# Patient Record
Sex: Male | Born: 1960 | Race: White | Hispanic: No | Marital: Married | Smoking: Never smoker
Health system: Southern US, Community
[De-identification: ages and names within clinical notes are randomized; demographics above are authoritative.]

## PROBLEM LIST (undated history)

## (undated) DIAGNOSIS — R011 Cardiac murmur, unspecified: Secondary | ICD-10-CM

## (undated) DIAGNOSIS — D6851 Activated protein C resistance: Secondary | ICD-10-CM

## (undated) DIAGNOSIS — Z87442 Personal history of urinary calculi: Secondary | ICD-10-CM

## (undated) DIAGNOSIS — J189 Pneumonia, unspecified organism: Secondary | ICD-10-CM

## (undated) DIAGNOSIS — M109 Gout, unspecified: Secondary | ICD-10-CM

## (undated) DIAGNOSIS — I671 Cerebral aneurysm, nonruptured: Secondary | ICD-10-CM

## (undated) DIAGNOSIS — K219 Gastro-esophageal reflux disease without esophagitis: Secondary | ICD-10-CM

## (undated) HISTORY — PX: CEREBRAL ANEURYSM REPAIR: SHX164

## (undated) HISTORY — PX: OTHER SURGICAL HISTORY: SHX169

## (undated) HISTORY — DX: Cerebral aneurysm, nonruptured: I67.1

## (undated) HISTORY — DX: Personal history of urinary calculi: Z87.442

## (undated) HISTORY — DX: Gout, unspecified: M10.9

## (undated) HISTORY — DX: Activated protein C resistance: D68.51

---

## 2018-12-17 ENCOUNTER — Other Ambulatory Visit: Payer: Self-pay | Admitting: Urology

## 2018-12-17 DIAGNOSIS — N281 Cyst of kidney, acquired: Secondary | ICD-10-CM

## 2018-12-18 ENCOUNTER — Ambulatory Visit
Admission: RE | Admit: 2018-12-18 | Discharge: 2018-12-18 | Disposition: A | Discharge status: Home / Self Care | Payer: BLUE CROSS/BLUE SHIELD | Source: Ambulatory Visit | Attending: Urology | Admitting: Urology

## 2018-12-18 DIAGNOSIS — N281 Cyst of kidney, acquired: Secondary | ICD-10-CM

## 2018-12-18 MED ORDER — GADOBENATE DIMEGLUMINE 529 MG/ML IV SOLN
20.0000 mL | Freq: Once | INTRAVENOUS | Status: AC | PRN
  Administered 2018-12-18: 20 mL via INTRAVENOUS

## 2018-12-28 DIAGNOSIS — R310 Gross hematuria: Secondary | ICD-10-CM | POA: Diagnosis not present

## 2018-12-28 DIAGNOSIS — N200 Calculus of kidney: Secondary | ICD-10-CM | POA: Diagnosis not present

## 2018-12-28 DIAGNOSIS — N135 Crossing vessel and stricture of ureter without hydronephrosis: Secondary | ICD-10-CM | POA: Diagnosis not present

## 2019-01-03 DIAGNOSIS — R8271 Bacteriuria: Secondary | ICD-10-CM | POA: Diagnosis not present

## 2019-01-03 DIAGNOSIS — R1084 Generalized abdominal pain: Secondary | ICD-10-CM | POA: Diagnosis not present

## 2019-01-03 DIAGNOSIS — N50811 Right testicular pain: Secondary | ICD-10-CM | POA: Diagnosis not present

## 2019-01-03 DIAGNOSIS — N200 Calculus of kidney: Secondary | ICD-10-CM | POA: Diagnosis not present

## 2019-01-25 DIAGNOSIS — N200 Calculus of kidney: Secondary | ICD-10-CM | POA: Diagnosis not present

## 2019-06-10 DIAGNOSIS — G479 Sleep disorder, unspecified: Secondary | ICD-10-CM | POA: Diagnosis not present

## 2019-08-09 ENCOUNTER — Other Ambulatory Visit: Payer: Self-pay

## 2019-08-09 DIAGNOSIS — Z20822 Contact with and (suspected) exposure to covid-19: Secondary | ICD-10-CM

## 2019-08-10 LAB — NOVEL CORONAVIRUS, NAA: SARS-CoV-2, NAA: NOT DETECTED

## 2020-11-27 DIAGNOSIS — H903 Sensorineural hearing loss, bilateral: Secondary | ICD-10-CM | POA: Diagnosis not present

## 2020-11-27 DIAGNOSIS — H9122 Sudden idiopathic hearing loss, left ear: Secondary | ICD-10-CM | POA: Diagnosis not present

## 2020-12-14 DIAGNOSIS — H9122 Sudden idiopathic hearing loss, left ear: Secondary | ICD-10-CM | POA: Diagnosis not present

## 2020-12-21 DIAGNOSIS — N50811 Right testicular pain: Secondary | ICD-10-CM | POA: Diagnosis not present

## 2020-12-21 DIAGNOSIS — Z87442 Personal history of urinary calculi: Secondary | ICD-10-CM | POA: Diagnosis not present

## 2020-12-21 DIAGNOSIS — Z79899 Other long term (current) drug therapy: Secondary | ICD-10-CM | POA: Diagnosis not present

## 2020-12-21 DIAGNOSIS — N200 Calculus of kidney: Secondary | ICD-10-CM | POA: Diagnosis not present

## 2020-12-21 DIAGNOSIS — R1011 Right upper quadrant pain: Secondary | ICD-10-CM | POA: Diagnosis not present

## 2020-12-21 DIAGNOSIS — R1031 Right lower quadrant pain: Secondary | ICD-10-CM | POA: Diagnosis not present

## 2020-12-21 DIAGNOSIS — R112 Nausea with vomiting, unspecified: Secondary | ICD-10-CM | POA: Diagnosis not present

## 2020-12-21 DIAGNOSIS — N132 Hydronephrosis with renal and ureteral calculous obstruction: Secondary | ICD-10-CM | POA: Diagnosis not present

## 2020-12-21 DIAGNOSIS — Z885 Allergy status to narcotic agent status: Secondary | ICD-10-CM | POA: Diagnosis not present

## 2020-12-22 DIAGNOSIS — N132 Hydronephrosis with renal and ureteral calculous obstruction: Secondary | ICD-10-CM | POA: Diagnosis not present

## 2020-12-24 DIAGNOSIS — N201 Calculus of ureter: Secondary | ICD-10-CM | POA: Diagnosis not present

## 2020-12-24 DIAGNOSIS — N135 Crossing vessel and stricture of ureter without hydronephrosis: Secondary | ICD-10-CM | POA: Diagnosis not present

## 2020-12-24 DIAGNOSIS — R1084 Generalized abdominal pain: Secondary | ICD-10-CM | POA: Diagnosis not present

## 2020-12-28 DIAGNOSIS — N201 Calculus of ureter: Secondary | ICD-10-CM | POA: Diagnosis not present

## 2021-01-05 DIAGNOSIS — N200 Calculus of kidney: Secondary | ICD-10-CM | POA: Diagnosis not present

## 2021-01-08 IMAGING — MR MR ABDOMEN WO/W CM
11 of 17 series · 30 of 48 positions shown · IV contrast (multihance)
Comparison: CT 12/14/2018

CLINICAL DATA: LEFT lower quadrant pain for 2 months. History of
kidney stones. Cystic mass of the LEFT kidney.

EXAM:
MRI ABDOMEN WITHOUT AND WITH CONTRAST
TECHNIQUE: Multiplanar multisequence MR imaging of the abdomen was performed
both before and after the administration of intravenous contrast.
CONTRAST:  20mL MULTIHANCE GADOBENATE DIMEGLUMINE 529 MG/ML IV SOLN

[Series 3: T2 · coronal · 5.0mm · 1.56mm/px · 2 of 20 slices shown (1 of 3)]
[im 1/20]
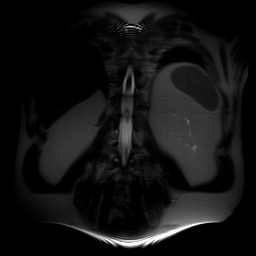
[im 20/20]
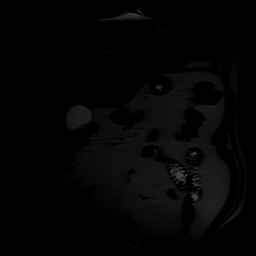

[Series 5: ep2d_diff_b50_500_800_p2 · axial · 6.0mm · 1.98mm/px · z∈[-78,+78]mm · 5 of 63 slices shown]
[im 1/63]
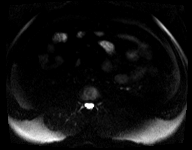
[im 16/63]
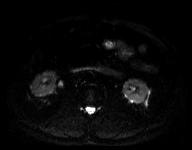
[im 32/63]
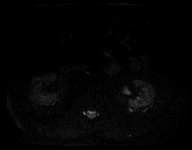
[im 47/63]
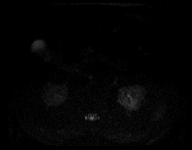
[im 63/63]
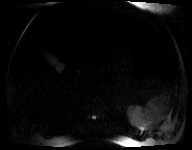

[Series 6: ep2d_diff_b50_500_800_p2_adc · axial · 6.0mm · 1.98mm/px · z∈[-78,+78]mm · 2 of 21 slices shown]
[im 1/21]
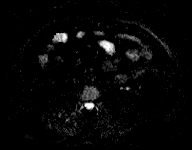
[im 21/21]
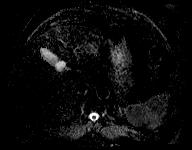

[Series 7: T2 · axial · 6.5mm · 0.74mm/px · z∈[-90,+90]mm · 2 of 24 slices shown (2 of 3)]
[im 1/24]
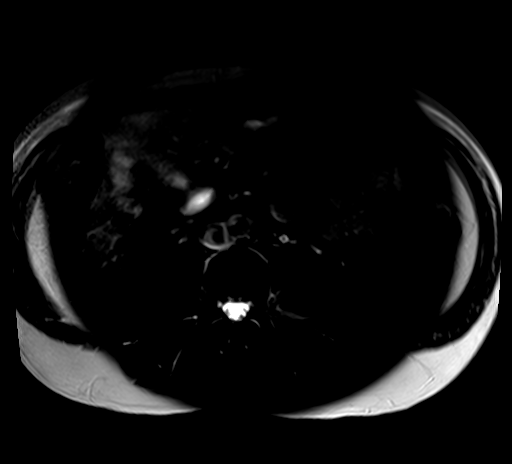
[im 24/24]
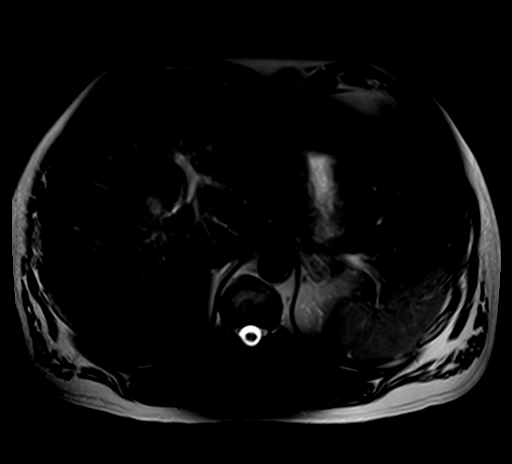

[Series 8: T2 · axial · 5.0mm · 1.48mm/px · z∈[-107,+82]mm · 3 of 30 slices shown (3 of 3)]
[im 1/30]
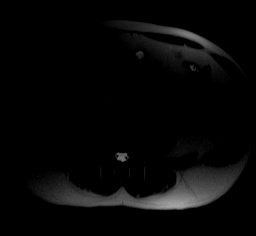
[im 15/30]
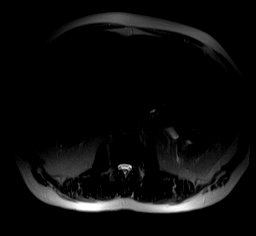
[im 30/30]
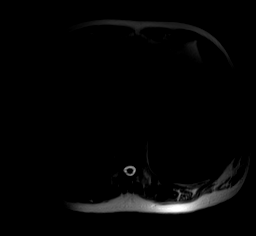

[Series 9: axial tru fisp · axial · 4.5mm · 1.48mm/px · 1 of 26 slices shown]
[im 1/26]
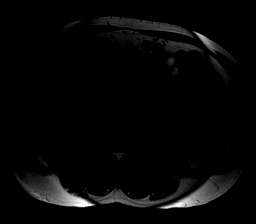

[Series 10: axial in out · axial · 5.5mm · 0.74mm/px · z∈[-98,+73]mm · 3 of 56 slices shown]
[im 1/56]
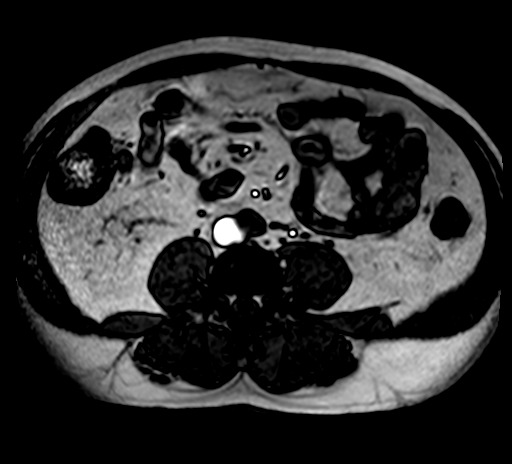
[im 28/56]
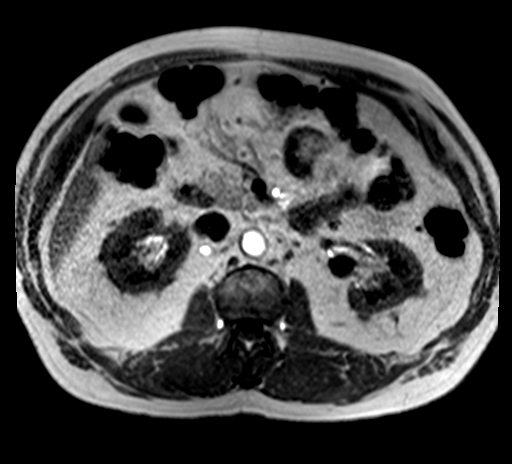
[im 56/56]
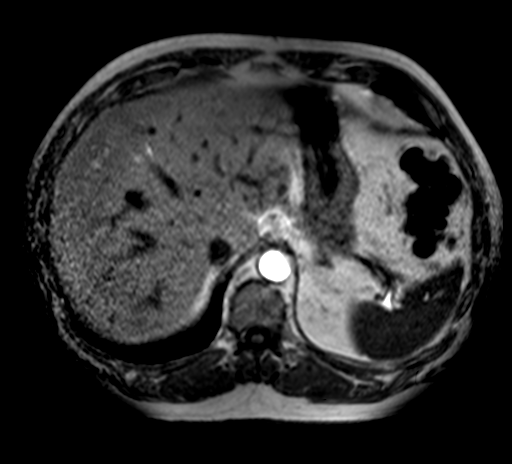

[Series 11: T1 dynamic · axial · non-contrast · 2.5mm · 0.78mm/px · z∈[-86,+61]mm · 3 of 60 slices shown]
[im 1/60]
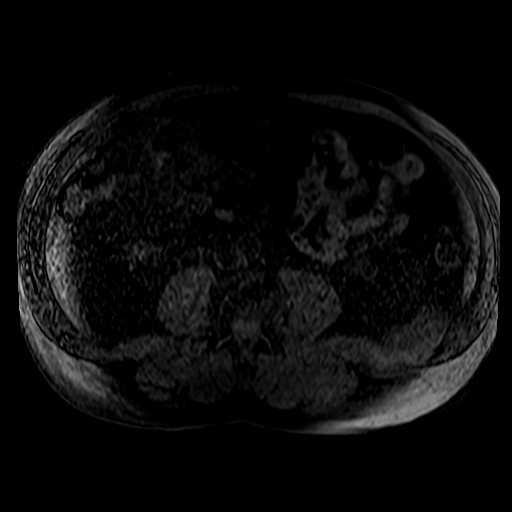
[im 30/60]
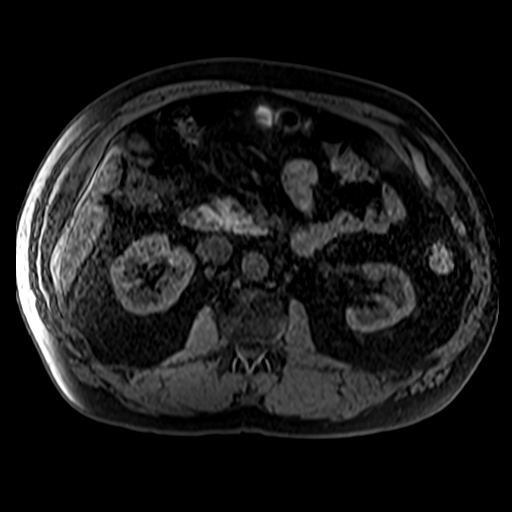
[im 60/60]
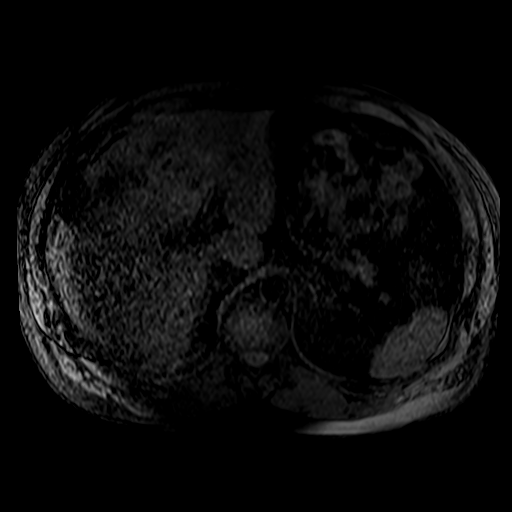

[Series 12: post 25 sec · axial · 2.5mm · 0.78mm/px · z∈[-86,+61]mm · 3 of 60 slices shown]
[im 1/60]
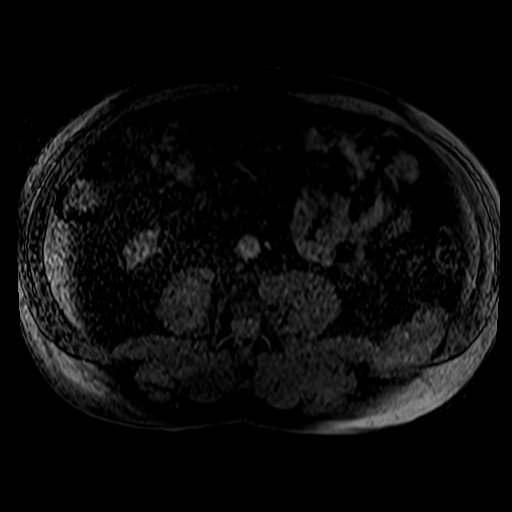
[im 30/60]
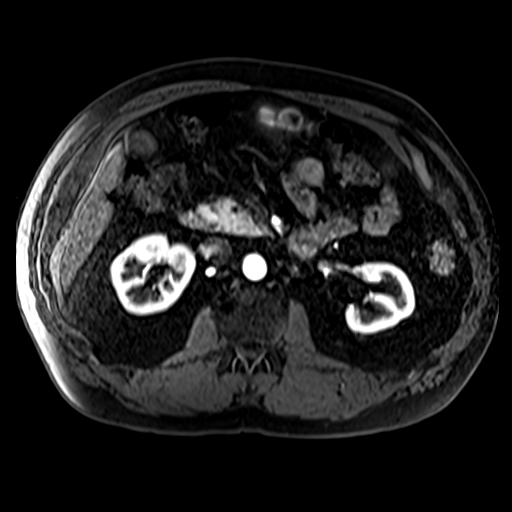
[im 60/60]
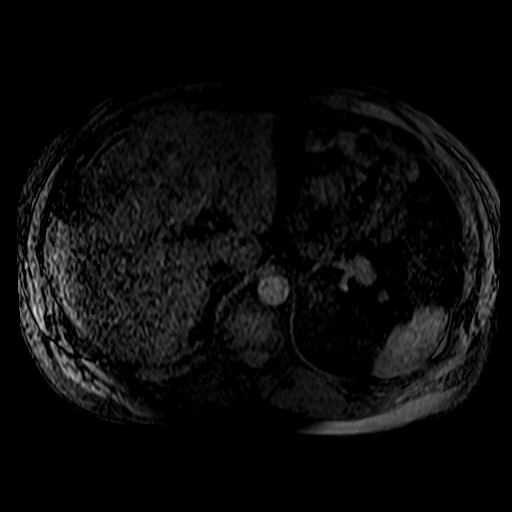

[Series 13: post 25 sec_sub · axial · 2.5mm · 0.78mm/px · z∈[-86,+61]mm · 3 of 60 slices shown]
[im 1/60]
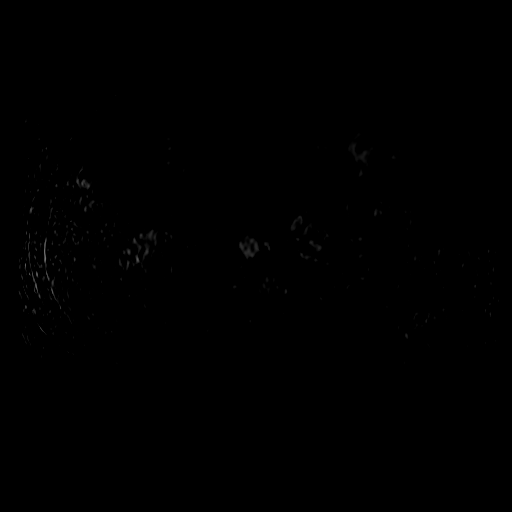
[im 30/60]
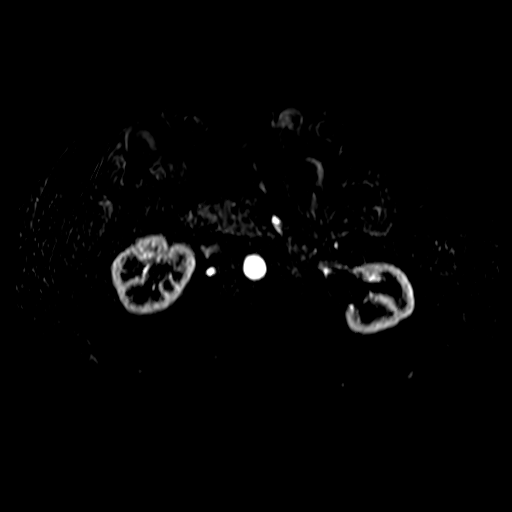
[im 60/60]
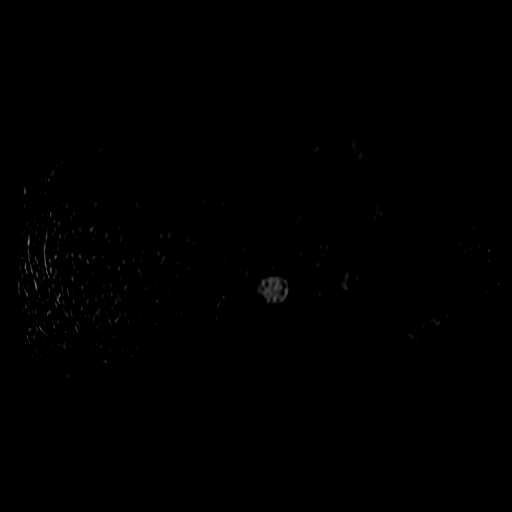

[Series 14: post 45 sec · axial · 2.5mm · 0.78mm/px · z∈[-86,+61]mm · 3 of 60 slices shown]
[im 1/60]
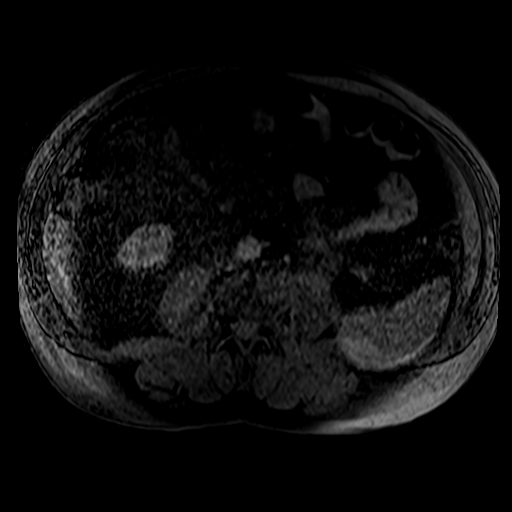
[im 30/60]
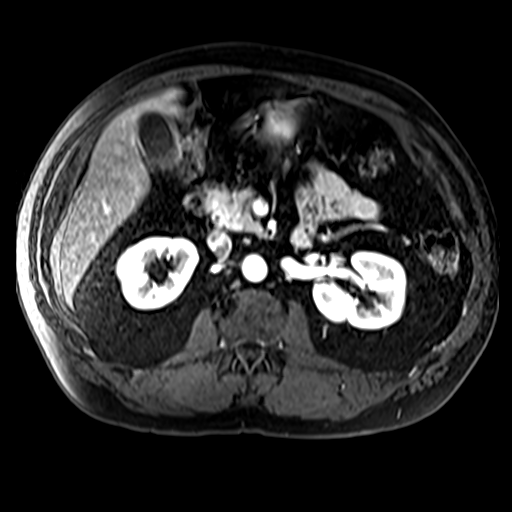
[im 60/60]
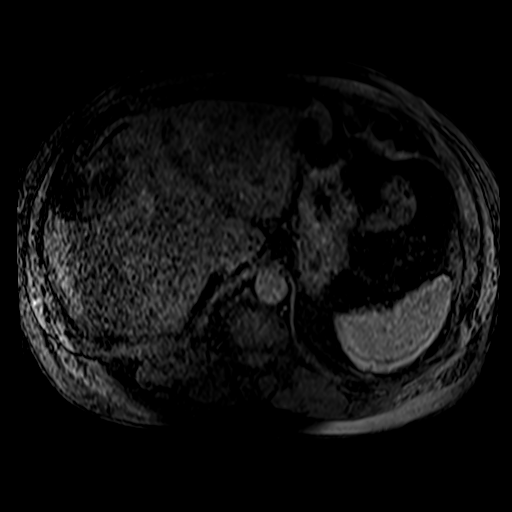

[30 of 48 positions shown; findings below may reference images not displayed]

FINDINGS: Lower chest:  Lung bases are clear.

Hepatobiliary: No focal hepatic lesion. Gallbladder normal. Common
bile duct normal.

Pancreas: Normal pancreatic parenchymal intensity. No ductal
dilatation or inflammation.

Spleen: Normal spleen.

Adrenals/urinary tract: Adrenal glands normal.

The small exophytic lesion extending from the posterior aspect of
the LEFT kidney is not evident on all sequences due to small size.

Lesion noted on the precontrast T1 weighted imaging measuring 9 mm
(image [DATE]) with lesion is isointense to the renal parenchyma..
Postcontrast imaging and subtracted postcontrast imaging
demonstrates no enhancement of this small lesion.

No additional enhancing lesion within LEFT RIGHT kidney.

Stomach/Bowel: Stomach and limited of the small bowel is
unremarkable

Vascular/Lymphatic: Abdominal aortic normal caliber. No
retroperitoneal periportal lymphadenopathy.

Musculoskeletal: No aggressive osseous lesion
IMPRESSION: 1. Nonenhancing lesion of the LEFT kidney is most consistent with
Bosniak 2 hemorrhagic cyst.
2. No acute abdominal findings.

## 2021-06-28 DIAGNOSIS — R509 Fever, unspecified: Secondary | ICD-10-CM | POA: Diagnosis not present

## 2021-06-28 DIAGNOSIS — U071 COVID-19: Secondary | ICD-10-CM | POA: Diagnosis not present

## 2021-07-09 DIAGNOSIS — H9122 Sudden idiopathic hearing loss, left ear: Secondary | ICD-10-CM | POA: Diagnosis not present

## 2021-07-09 DIAGNOSIS — H903 Sensorineural hearing loss, bilateral: Secondary | ICD-10-CM | POA: Diagnosis not present

## 2021-08-23 DIAGNOSIS — M109 Gout, unspecified: Secondary | ICD-10-CM | POA: Diagnosis not present

## 2021-08-23 DIAGNOSIS — R030 Elevated blood-pressure reading, without diagnosis of hypertension: Secondary | ICD-10-CM | POA: Diagnosis not present

## 2021-08-23 DIAGNOSIS — R519 Headache, unspecified: Secondary | ICD-10-CM | POA: Diagnosis not present

## 2021-08-27 ENCOUNTER — Other Ambulatory Visit: Payer: Self-pay | Admitting: Family Medicine

## 2021-08-27 DIAGNOSIS — R519 Headache, unspecified: Secondary | ICD-10-CM

## 2021-08-30 ENCOUNTER — Other Ambulatory Visit: Payer: Self-pay

## 2021-08-30 ENCOUNTER — Ambulatory Visit (INDEPENDENT_AMBULATORY_CARE_PROVIDER_SITE_OTHER): Payer: BC Managed Care – PPO

## 2021-08-30 DIAGNOSIS — H9012 Conductive hearing loss, unilateral, left ear, with unrestricted hearing on the contralateral side: Secondary | ICD-10-CM

## 2021-08-30 DIAGNOSIS — R519 Headache, unspecified: Secondary | ICD-10-CM | POA: Diagnosis not present

## 2021-08-30 DIAGNOSIS — G9389 Other specified disorders of brain: Secondary | ICD-10-CM | POA: Diagnosis not present

## 2021-08-30 MED ORDER — GADOBUTROL 1 MMOL/ML IV SOLN
10.0000 mL | Freq: Once | INTRAVENOUS | Status: AC | PRN
  Administered 2021-08-30: 10 mL via INTRAVENOUS

## 2021-09-01 DIAGNOSIS — E237 Disorder of pituitary gland, unspecified: Secondary | ICD-10-CM | POA: Diagnosis not present

## 2021-09-02 DIAGNOSIS — E237 Disorder of pituitary gland, unspecified: Secondary | ICD-10-CM | POA: Diagnosis not present

## 2021-09-08 DIAGNOSIS — I609 Nontraumatic subarachnoid hemorrhage, unspecified: Secondary | ICD-10-CM | POA: Diagnosis not present

## 2021-09-08 DIAGNOSIS — D497 Neoplasm of unspecified behavior of endocrine glands and other parts of nervous system: Secondary | ICD-10-CM | POA: Diagnosis not present

## 2021-09-08 DIAGNOSIS — R030 Elevated blood-pressure reading, without diagnosis of hypertension: Secondary | ICD-10-CM | POA: Diagnosis not present

## 2021-09-08 DIAGNOSIS — Z6830 Body mass index (BMI) 30.0-30.9, adult: Secondary | ICD-10-CM | POA: Diagnosis not present

## 2021-09-15 DIAGNOSIS — Z13220 Encounter for screening for lipoid disorders: Secondary | ICD-10-CM | POA: Diagnosis not present

## 2021-09-15 DIAGNOSIS — E538 Deficiency of other specified B group vitamins: Secondary | ICD-10-CM | POA: Diagnosis not present

## 2021-09-15 DIAGNOSIS — Z0000 Encounter for general adult medical examination without abnormal findings: Secondary | ICD-10-CM | POA: Diagnosis not present

## 2021-09-22 DIAGNOSIS — I609 Nontraumatic subarachnoid hemorrhage, unspecified: Secondary | ICD-10-CM | POA: Diagnosis not present

## 2021-09-22 DIAGNOSIS — R519 Headache, unspecified: Secondary | ICD-10-CM | POA: Diagnosis not present

## 2021-09-22 DIAGNOSIS — E538 Deficiency of other specified B group vitamins: Secondary | ICD-10-CM | POA: Diagnosis not present

## 2021-09-29 DIAGNOSIS — I609 Nontraumatic subarachnoid hemorrhage, unspecified: Secondary | ICD-10-CM | POA: Diagnosis not present

## 2021-09-29 DIAGNOSIS — D497 Neoplasm of unspecified behavior of endocrine glands and other parts of nervous system: Secondary | ICD-10-CM | POA: Diagnosis not present

## 2021-09-29 DIAGNOSIS — M542 Cervicalgia: Secondary | ICD-10-CM | POA: Diagnosis not present

## 2021-10-01 ENCOUNTER — Encounter (HOSPITAL_COMMUNITY): Payer: Self-pay | Admitting: Radiology

## 2021-10-13 ENCOUNTER — Encounter: Payer: Self-pay | Admitting: Neurology

## 2021-12-01 NOTE — Progress Notes (Signed)
NEUROLOGY CONSULTATION NOTE  Samuel West MRN: [id number] DOB: 1961/07/24  Referring provider: Consuella Lose, MD Primary care provider: Sammuel Hines, PA-C  Reason for consult:  headache  Assessment/Plan:   Probable right-sided cervicogenic headache.  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  Also ondansetron for nausea 3  follow up with PCP regarding blood pressure 4  Obtain MRA report 5  Follow up 4 months.   Subjective:  Samuel West is a 61 year old male who presents for headache.  History supplemented by referring provider's note.  Patient had a SAH secondary to aneurysm rupture in 2006.  He had some headaches afterwards which subsequently resolved.  Otherwise, no history of headaches.    About 4 months ago, he started experiencing new headaches.  He describes severe aching pain from the right occipital/suboccipital region radiating to the right parietal region.  There is associated dizziness, nausea and photophobia.  When severe, he may vomit.  Denies visual disturbance and paresthesias in the back of the head.  Denies radicular symptoms in the right upper extremity. Headaches are daily with variable duration.  Neck movement aggravates.  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  Follow up serum endocrine profile (ACTH, prolactin, IGF-1, etc) was negative.  He followed up with neurosurgery who determined that findings were incidental.  Recommended repeat MRI in a year.  Due to history of aneurysm, he had an MRA of the head that was reportedly unremarkable  (imaging and report not available.  He recommended MRA of head in 5 years.  For headache and neck pain, he was prescribed prednisone, diclofenac and cyclobenzaprine.  Around this same time, he developed hearing loss and tinnitus in his left ear.  He saw ENT and was found to have sensorineural hearing loss in the left ear.  Current NSAIDS/analgesics:  diclofenac 50mg  BID PRN Current muscle relaxants:  cyclobenzaprine 10mg  BID PRN Current Antihypertensive medications:  none Current Antidepressant medications:  none Current Anticonvulsant medications:  none Current anti-CGRP:  none Current Vitamins/Herbal/Supplements:  none Current Antihistamines/Decongestants:  none Other therapy:  none  Past NSAIDS/analgesics:  Excedrin migraine Past steroid:  prednisone  PAST MEDICAL HISTORY: Past Medical History:  Diagnosis Date   Brain aneurysm    Factor 5 Leiden mutation, heterozygous ([facility])    Gout    History of kidney stones      PAST SURGICAL HISTORY: Cerebral aneurysm repair  MEDICATIONS: Current Outpatient Medications on File Prior to Visit  Medication Sig Dispense Refill   allopurinol (ZYLOPRIM) 300 MG tablet Take 300 mg by mouth as needed.     predniSONE (STERAPRED UNI-PAK 21 TAB) 10 MG (21) TBPK tablet See admin instructions.     cyanocobalamin (,VITAMIN B-12,) 1000 MCG/ML injection Inject 1,000 mcg into the muscle every 30 (thirty) days.     cyclobenzaprine (FLEXERIL) 10 MG tablet Take 10 mg by mouth 2 (two) times daily as needed.     diclofenac (CATAFLAM) 50 MG tablet Take 50 mg by mouth 2 (two) times daily as needed.     zolpidem (AMBIEN) 10 MG tablet Take 10 mg by mouth at bedtime.  No current facility-administered medications on file prior to visit.      ALLERGIES: Allergies  Allergen Reactions   Morphine And Related Nausea And Vomiting     FAMILY HISTORY: Family History  Problem Relation Age of Onset   Throat cancer Father     Objective:  Blood pressure (!)  154/105, pulse 94, height 6' (1.829 m), weight 240 lb 6.4 oz (109 kg), SpO2 94 %. General: No acute distress.  Patient appears well-groomed.   Head:  Normocephalic/atraumatic.  Tenderness to palpation of right occipital region. Eyes:  fundi examined but not visualized Neck: supple, no paraspinal tenderness, full range of motion Back: No paraspinal tenderness Heart: regular rate and rhythm Lungs: Clear to auscultation bilaterally. Vascular: No carotid bruits. Neurological Exam: Mental status: alert and oriented to person, place, and time, recent and remote memory intact, fund of knowledge intact, attention and concentration intact, speech fluent and not dysarthric, language intact. Cranial nerves: CN I: not tested CN II: pupils equal, round and reactive to light, visual fields intact CN III, IV, VI:  full range of motion, no nystagmus, no ptosis CN V: facial sensation intact. CN VII: upper and lower face symmetric CN VIII: hearing reduced in left ear CN IX, X: gag intact, uvula midline CN XI: sternocleidomastoid and trapezius muscles intact CN XII: tongue midline Bulk & Tone: normal, no fasciculations. Motor:  muscle strength 5/5 throughout Sensation:  Pinprick, temperature and vibratory sensation intact. Deep Tendon Reflexes:  2+ throughout,  toes downgoing.   Finger to nose testing:  Without dysmetria.   Heel to shin:  Without dysmetria.   Gait:  Normal station and stride.  Romberg negative.    Thank you for allowing me to take part in the care of this patient.  Metta Clines, DO  CC:  Sammuel Hines, PA-C  Consuella Lose, MD

## 2021-12-02 ENCOUNTER — Ambulatory Visit (INDEPENDENT_AMBULATORY_CARE_PROVIDER_SITE_OTHER): Payer: BC Managed Care – PPO | Admitting: Neurology

## 2021-12-02 ENCOUNTER — Encounter: Payer: Self-pay | Admitting: Neurology

## 2021-12-02 ENCOUNTER — Other Ambulatory Visit: Payer: Self-pay

## 2021-12-02 VITALS — BP 154/105 | HR 94 | Ht 72.0 in | Wt 240.4 lb

## 2021-12-02 DIAGNOSIS — H9312 Tinnitus, left ear: Secondary | ICD-10-CM | POA: Diagnosis not present

## 2021-12-02 DIAGNOSIS — H905 Unspecified sensorineural hearing loss: Secondary | ICD-10-CM | POA: Diagnosis not present

## 2021-12-02 DIAGNOSIS — E236 Other disorders of pituitary gland: Secondary | ICD-10-CM

## 2021-12-02 DIAGNOSIS — Z8679 Personal history of other diseases of the circulatory system: Secondary | ICD-10-CM

## 2021-12-02 DIAGNOSIS — G4486 Cervicogenic headache: Secondary | ICD-10-CM

## 2021-12-02 MED ORDER — ONDANSETRON 4 MG PO TBDP: 4.0000 mg | ORAL_TABLET | Freq: Three times a day (TID) | ORAL | 5 refills | Status: DC | PRN

## 2021-12-02 MED ORDER — GABAPENTIN 100 MG PO CAPS: ORAL_CAPSULE | ORAL | 0 refills | Status: DC

## 2021-12-02 NOTE — Patient Instructions (Signed)
Start gabapentin - take 1 pill at bedtime for one week, then increase to 2 pills at bedtime.  When time for refill, contact me with update Continue cyclobenzaprine and diclofenac as needed. Follow up 4 months.

## 2021-12-24 DIAGNOSIS — E538 Deficiency of other specified B group vitamins: Secondary | ICD-10-CM | POA: Diagnosis not present

## 2022-01-11 ENCOUNTER — Ambulatory Visit: Payer: BC Managed Care – PPO | Admitting: Neurology

## 2022-01-17 DIAGNOSIS — U071 COVID-19: Secondary | ICD-10-CM | POA: Diagnosis not present

## 2022-01-17 DIAGNOSIS — I10 Essential (primary) hypertension: Secondary | ICD-10-CM | POA: Diagnosis not present

## 2022-04-01 ENCOUNTER — Ambulatory Visit: Payer: BC Managed Care – PPO | Admitting: Neurology

## 2022-04-11 DIAGNOSIS — M25562 Pain in left knee: Secondary | ICD-10-CM | POA: Diagnosis not present

## 2022-04-11 DIAGNOSIS — M1712 Unilateral primary osteoarthritis, left knee: Secondary | ICD-10-CM | POA: Diagnosis not present

## 2022-04-27 DIAGNOSIS — E538 Deficiency of other specified B group vitamins: Secondary | ICD-10-CM | POA: Diagnosis not present

## 2022-11-09 ENCOUNTER — Other Ambulatory Visit: Payer: Self-pay

## 2022-11-09 ENCOUNTER — Emergency Department (HOSPITAL_BASED_OUTPATIENT_CLINIC_OR_DEPARTMENT_OTHER): Payer: Self-pay

## 2022-11-09 ENCOUNTER — Inpatient Hospital Stay (HOSPITAL_BASED_OUTPATIENT_CLINIC_OR_DEPARTMENT_OTHER)
Admission: EM | Admit: 2022-11-09 | Discharge: 2022-11-12 | DRG: 193 | Disposition: A | Discharge status: Home / Self Care | Payer: Self-pay | Attending: Internal Medicine | Admitting: Internal Medicine

## 2022-11-09 DIAGNOSIS — M109 Gout, unspecified: Secondary | ICD-10-CM | POA: Diagnosis present

## 2022-11-09 DIAGNOSIS — R7989 Other specified abnormal findings of blood chemistry: Secondary | ICD-10-CM | POA: Diagnosis present

## 2022-11-09 DIAGNOSIS — Z808 Family history of malignant neoplasm of other organs or systems: Secondary | ICD-10-CM

## 2022-11-09 DIAGNOSIS — D6851 Activated protein C resistance: Secondary | ICD-10-CM | POA: Diagnosis present

## 2022-11-09 DIAGNOSIS — Z1152 Encounter for screening for COVID-19: Secondary | ICD-10-CM

## 2022-11-09 DIAGNOSIS — J189 Pneumonia, unspecified organism: Principal | ICD-10-CM

## 2022-11-09 DIAGNOSIS — I10 Essential (primary) hypertension: Secondary | ICD-10-CM | POA: Diagnosis present

## 2022-11-09 DIAGNOSIS — Z79899 Other long term (current) drug therapy: Secondary | ICD-10-CM

## 2022-11-09 DIAGNOSIS — K760 Fatty (change of) liver, not elsewhere classified: Secondary | ICD-10-CM | POA: Diagnosis present

## 2022-11-09 DIAGNOSIS — J9601 Acute respiratory failure with hypoxia: Secondary | ICD-10-CM | POA: Diagnosis present

## 2022-11-09 DIAGNOSIS — J45909 Unspecified asthma, uncomplicated: Secondary | ICD-10-CM | POA: Diagnosis present

## 2022-11-09 DIAGNOSIS — Z8673 Personal history of transient ischemic attack (TIA), and cerebral infarction without residual deficits: Secondary | ICD-10-CM

## 2022-11-09 DIAGNOSIS — Z23 Encounter for immunization: Secondary | ICD-10-CM

## 2022-11-09 DIAGNOSIS — R0902 Hypoxemia: Secondary | ICD-10-CM

## 2022-11-09 LAB — CBC WITH DIFFERENTIAL/PLATELET
Abs Immature Granulocytes: 0.55 10*3/uL — ABNORMAL HIGH (ref 0.00–0.07)
Basophils Absolute: 0.2 10*3/uL — ABNORMAL HIGH (ref 0.0–0.1)
Basophils Relative: 1 %
Eosinophils Absolute: 0.1 10*3/uL (ref 0.0–0.5)
Eosinophils Relative: 0 %
HCT: 46.5 % (ref 39.0–52.0)
Hemoglobin: 15.6 g/dL (ref 13.0–17.0)
Immature Granulocytes: 3 %
Lymphocytes Relative: 8 %
Lymphs Abs: 1.5 10*3/uL (ref 0.7–4.0)
MCH: 29.3 pg (ref 26.0–34.0)
MCHC: 33.5 g/dL (ref 30.0–36.0)
MCV: 87.2 fL (ref 80.0–100.0)
Monocytes Absolute: 1.7 10*3/uL — ABNORMAL HIGH (ref 0.1–1.0)
Monocytes Relative: 9 %
Neutro Abs: 15.6 10*3/uL — ABNORMAL HIGH (ref 1.7–7.7)
Neutrophils Relative %: 79 %
Platelets: 416 10*3/uL — ABNORMAL HIGH (ref 150–400)
RBC: 5.33 MIL/uL (ref 4.22–5.81)
RDW: 13.4 % (ref 11.5–15.5)
WBC: 19.5 10*3/uL — ABNORMAL HIGH (ref 4.0–10.5)
nRBC: 0 % (ref 0.0–0.2)

## 2022-11-09 LAB — COMPREHENSIVE METABOLIC PANEL
ALT: 117 U/L — ABNORMAL HIGH (ref 0–44)
AST: 97 U/L — ABNORMAL HIGH (ref 15–41)
Albumin: 3.4 g/dL — ABNORMAL LOW (ref 3.5–5.0)
Alkaline Phosphatase: 81 U/L (ref 38–126)
Anion gap: 11 (ref 5–15)
BUN: 14 mg/dL (ref 8–23)
CO2: 24 mmol/L (ref 22–32)
Calcium: 8.9 mg/dL (ref 8.9–10.3)
Chloride: 98 mmol/L (ref 98–111)
Creatinine, Ser: 1.03 mg/dL (ref 0.61–1.24)
GFR, Estimated: >60 mL/min (ref >60)
Glucose, Bld: 117 mg/dL — ABNORMAL HIGH (ref 70–99)
Potassium: 3.7 mmol/L (ref 3.5–5.1)
Sodium: 133 mmol/L — ABNORMAL LOW (ref 135–145)
Total Bilirubin: 1.1 mg/dL (ref 0.3–1.2)
Total Protein: 8.2 g/dL — ABNORMAL HIGH (ref 6.5–8.1)

## 2022-11-09 LAB — LACTIC ACID, PLASMA: Lactic Acid, Venous: 1.4 mmol/L (ref 0.5–1.9)

## 2022-11-09 MED ORDER — ACETAMINOPHEN 500 MG PO TABS
1000.0000 mg | ORAL_TABLET | Freq: Once | ORAL | Status: AC
  Administered 2022-11-09: 1000 mg via ORAL
  Filled 2022-11-09: qty 2

## 2022-11-09 MED ORDER — ALBUTEROL SULFATE HFA 108 (90 BASE) MCG/ACT IN AERS
2.0000 | INHALATION_SPRAY | RESPIRATORY_TRACT | Status: DC | PRN
  Administered 2022-11-10: 2 via RESPIRATORY_TRACT
  Filled 2022-11-09: qty 6.7

## 2022-11-09 NOTE — ED Provider Notes (Signed)
[facility] Provider Note: Samuel Dell, MD, FACEP  CSN: [id number] MRN: [id number] ARRIVAL: 11/09/22 at 2239 ROOM: [facility]06/[facility]06   CHIEF COMPLAINT  Shortness of Breath   HISTORY OF PRESENT ILLNESS  11/09/22 11:13 PM Samuel West is a 61 y.o. male who developed flulike symptoms in [place] 7 days ago.  Specifically he had fever, body aches, cough, nasal congestion, shortness of breath and general malaise.  He was tested for COVID 5 days ago and this was negative.  He was tested for influenza 3 days ago and this was positive.    His symptoms have subsequently worsened.  He is having difficulty breathing, worse with exertion.  He was noted to be hypoxic in triage with a sat of 91% on room air which decreased to 88% on ambulation.  He was also noted to be febrile and tachycardic and was given acetaminophen.  He has had a loss of appetite and has been more confused than usual.  His cough has been severe at times.  He has not had vomiting or diarrhea.   Past Medical History:  Diagnosis Date   Brain aneurysm    Factor 5 Leiden mutation, heterozygous ([facility])    Gout    History of kidney stones     No past surgical history on file.  Family History  Problem Relation Age of Onset   Throat cancer Father        Prior to Admission medications   Medication Sig Start Date End Date Taking? Authorizing Provider  allopurinol (ZYLOPRIM) 300 MG tablet Take 300 mg by mouth as needed. 07/03/18   [provider]  cyanocobalamin (,VITAMIN B-12,) 1000 MCG/ML injection Inject 1,000 mcg into the muscle every 30 (thirty) days. 09/22/21   [provider]  cyclobenzaprine (FLEXERIL) 10 MG tablet Take 10 mg by mouth 2 (two) times daily as needed. 11/29/21   [provider]  diclofenac (CATAFLAM) 50 MG tablet Take 50 mg by mouth 2 (two) times daily as needed. 11/29/21   [provider]  gabapentin (NEURONTIN) 100 MG capsule Take 1 capsule at bedtime for one week, then  2 capsules at bedtime. 12/02/21   Everlena Cooper, Adam R, DO  ondansetron (ZOFRAN-ODT) 4 MG disintegrating tablet Take 1 tablet (4 mg total) by mouth every 8 (eight) hours as needed for nausea or vomiting. 12/02/21   Everlena Cooper, Adam R, DO  predniSONE (STERAPRED UNI-PAK 21 TAB) 10 MG (21) TBPK tablet See admin instructions. 09/08/21   [provider]  zolpidem (AMBIEN) 10 MG tablet Take 10 mg by mouth at bedtime. 11/09/21   [provider]    Allergies Morphine and related   REVIEW OF SYSTEMS  Negative except as noted here or in the History of Present Illness.   PHYSICAL EXAMINATION  Initial Vital Signs Blood pressure (!) 169/105, pulse (!) 126, temperature (!) 102 F (38.9 C), resp. rate 20, height 6\' 1"  (1.854 m), weight 108.9 kg, SpO2 91 %.  Examination General: Well-developed, well-nourished male in no acute distress; appearance consistent with age of record HENT: normocephalic; atraumatic Eyes: Normal appearance Neck: supple Heart: regular rate and rhythm; tachycardia Lungs: Decreased air movement in bases; shallow breaths Abdomen: soft; nondistended; nontender; bowel sounds present Extremities: No deformity; full range of motion; pulses normal Neurologic: Awake, alert; motor function intact in all extremities and symmetric; no facial droop Skin: Warm and dry Psychiatric: Normal mood and affect   RESULTS  Summary of this visit's results, reviewed and interpreted by myself:  EKG Interpretation  Date/Time:  Wednesday November 09 2022 23:09:09 EST Ventricular Rate:  125 PR Interval:  168 QRS Duration: 94 QT Interval:  292 QTC Calculation: 421 R Axis:   55 Text Interpretation: Sinus tachycardia Anterior infarct , age undetermined Abnormal ECG No previous ECGs available Confirmed by Shanon Rosser [phone]) on 11/09/2022 11:13:21 PM       Laboratory Studies: No results found for this or any previous visit (from the past 24 hour(s)). Imaging Studies: No results  found.  ED COURSE and MDM  Nursing notes, initial and subsequent vitals signs, including pulse oximetry, reviewed and interpreted by myself.  Vitals:   11/09/22 2256 11/09/22 2256  BP:  (!) 169/105  Pulse:  (!) 126  Resp:  20  Temp:  (!) 102 F (38.9 C)  SpO2:  91%  Weight: 108.9 kg   Height: 6\' 1"  (1.854 m)    Medications  albuterol (VENTOLIN HFA) 108 (90 Base) MCG/ACT inhaler 2 puff (has no administration in time range)  acetaminophen (TYLENOL) tablet 1,000 mg (1,000 mg Oral Given 11/09/22 2305)      PROCEDURES  Procedures   ED DIAGNOSES  No diagnosis found.

## 2022-11-09 NOTE — ED Notes (Signed)
Pt was in [place] last week began feeling sick last Wed returned home on Sunday and tested positive for the flu Pt has had increased difficulty breathing x 2 days, today being the worst.  Resp rate 35-40 bpm  RT at bedside

## 2022-11-09 NOTE — ED Triage Notes (Signed)
Pt was diagnosed with flu x 4 days ago. Pt states that his symptoms have worsened. Pt is hypoxic in triage at 91% RA while sitting and sats decrease to 88% when he stands to ambulate.

## 2022-11-10 ENCOUNTER — Encounter (HOSPITAL_BASED_OUTPATIENT_CLINIC_OR_DEPARTMENT_OTHER): Payer: Self-pay

## 2022-11-10 ENCOUNTER — Inpatient Hospital Stay (HOSPITAL_COMMUNITY): Payer: Self-pay

## 2022-11-10 DIAGNOSIS — J9601 Acute respiratory failure with hypoxia: Secondary | ICD-10-CM

## 2022-11-10 DIAGNOSIS — J189 Pneumonia, unspecified organism: Secondary | ICD-10-CM

## 2022-11-10 DIAGNOSIS — I10 Essential (primary) hypertension: Secondary | ICD-10-CM | POA: Diagnosis present

## 2022-11-10 DIAGNOSIS — D6851 Activated protein C resistance: Secondary | ICD-10-CM | POA: Diagnosis present

## 2022-11-10 DIAGNOSIS — J45909 Unspecified asthma, uncomplicated: Secondary | ICD-10-CM | POA: Diagnosis present

## 2022-11-10 DIAGNOSIS — R7989 Other specified abnormal findings of blood chemistry: Secondary | ICD-10-CM | POA: Diagnosis present

## 2022-11-10 HISTORY — DX: Acute respiratory failure with hypoxia: J96.01

## 2022-11-10 LAB — COMPREHENSIVE METABOLIC PANEL
ALT: 95 U/L — ABNORMAL HIGH (ref 0–44)
AST: 65 U/L — ABNORMAL HIGH (ref 15–41)
Albumin: 3.3 g/dL — ABNORMAL LOW (ref 3.5–5.0)
Alkaline Phosphatase: 71 U/L (ref 38–126)
Anion gap: 10 (ref 5–15)
BUN: 14 mg/dL (ref 8–23)
CO2: 22 mmol/L (ref 22–32)
Calcium: 8.5 mg/dL — ABNORMAL LOW (ref 8.9–10.3)
Chloride: 100 mmol/L (ref 98–111)
Creatinine, Ser: 0.74 mg/dL (ref 0.61–1.24)
GFR, Estimated: >60 mL/min (ref >60)
Glucose, Bld: 98 mg/dL (ref 70–99)
Potassium: 3.6 mmol/L (ref 3.5–5.1)
Sodium: 132 mmol/L — ABNORMAL LOW (ref 135–145)
Total Bilirubin: 1 mg/dL (ref 0.3–1.2)
Total Protein: 7.5 g/dL (ref 6.5–8.1)

## 2022-11-10 LAB — CBC WITH DIFFERENTIAL/PLATELET
Abs Immature Granulocytes: 0.58 10*3/uL — ABNORMAL HIGH (ref 0.00–0.07)
Basophils Absolute: 0.1 10*3/uL (ref 0.0–0.1)
Basophils Relative: 1 %
Eosinophils Absolute: 0 10*3/uL (ref 0.0–0.5)
Eosinophils Relative: 0 %
HCT: 42.1 % (ref 39.0–52.0)
Hemoglobin: 14 g/dL (ref 13.0–17.0)
Immature Granulocytes: 3 %
Lymphocytes Relative: 10 %
Lymphs Abs: 2.2 10*3/uL (ref 0.7–4.0)
MCH: 29.7 pg (ref 26.0–34.0)
MCHC: 33.3 g/dL (ref 30.0–36.0)
MCV: 89.4 fL (ref 80.0–100.0)
Monocytes Absolute: 1.7 10*3/uL — ABNORMAL HIGH (ref 0.1–1.0)
Monocytes Relative: 8 %
Neutro Abs: 17.4 10*3/uL — ABNORMAL HIGH (ref 1.7–7.7)
Neutrophils Relative %: 78 %
Platelets: 389 10*3/uL (ref 150–400)
RBC: 4.71 MIL/uL (ref 4.22–5.81)
RDW: 13.6 % (ref 11.5–15.5)
WBC: 22 10*3/uL — ABNORMAL HIGH (ref 4.0–10.5)
nRBC: 0 % (ref 0.0–0.2)

## 2022-11-10 LAB — RESPIRATORY PANEL BY PCR

## 2022-11-10 LAB — BLOOD GAS, ARTERIAL
Acid-Base Excess: 0.1 mmol/L (ref 0.0–2.0)
Bicarbonate: 23.6 mmol/L (ref 20.0–28.0)
O2 Content: 10 L/min
O2 Saturation: 98.7 %
Patient temperature: 37
pCO2 arterial: 34 mmHg (ref 32–48)
pH, Arterial: 7.45 (ref 7.35–7.45)
pO2, Arterial: 93 mmHg (ref 83–108)

## 2022-11-10 LAB — HEPATITIS PANEL, ACUTE
HCV Ab: NONREACTIVE
Hep A IgM: NONREACTIVE
Hep B C IgM: NONREACTIVE
Hepatitis B Surface Ag: NONREACTIVE

## 2022-11-10 LAB — RESP PANEL BY RT-PCR (RSV, FLU A&B, COVID)  RVPGX2
Influenza A by PCR: NEGATIVE
Influenza A by PCR: NEGATIVE
Influenza B by PCR: NEGATIVE
Influenza B by PCR: NEGATIVE
Resp Syncytial Virus by PCR: NEGATIVE
Resp Syncytial Virus by PCR: NEGATIVE
SARS Coronavirus 2 by RT PCR: NEGATIVE
SARS Coronavirus 2 by RT PCR: NEGATIVE

## 2022-11-10 LAB — D-DIMER, QUANTITATIVE: D-Dimer, Quant: 3.61 ug/mL-FEU — ABNORMAL HIGH (ref 0.00–0.50)

## 2022-11-10 LAB — HIV ANTIBODY (ROUTINE TESTING W REFLEX): HIV Screen 4th Generation wRfx: NONREACTIVE

## 2022-11-10 LAB — MRSA NEXT GEN BY PCR, NASAL: MRSA by PCR Next Gen: NOT DETECTED

## 2022-11-10 LAB — STREP PNEUMONIAE URINARY ANTIGEN: Strep Pneumo Urinary Antigen: NEGATIVE

## 2022-11-10 MED ORDER — SODIUM CHLORIDE (PF) 0.9 % IJ SOLN
INTRAMUSCULAR | Status: AC
  Filled 2022-11-10: qty 50

## 2022-11-10 MED ORDER — CYCLOBENZAPRINE HCL 10 MG PO TABS: 10.0000 mg | ORAL_TABLET | Freq: Two times a day (BID) | ORAL | Status: DC | PRN

## 2022-11-10 MED ORDER — ENOXAPARIN SODIUM 40 MG/0.4ML IJ SOSY
40.0000 mg | PREFILLED_SYRINGE | INTRAMUSCULAR | Status: DC
  Administered 2022-11-10 – 2022-11-11 (×2): 40 mg via SUBCUTANEOUS
  Filled 2022-11-10 (×2): qty 0.4

## 2022-11-10 MED ORDER — GUAIFENESIN 100 MG/5ML PO LIQD
15.0000 mL | Freq: Once | ORAL | Status: AC
  Administered 2022-11-10: 15 mL via ORAL
  Filled 2022-11-10: qty 20

## 2022-11-10 MED ORDER — SODIUM CHLORIDE 0.9 % IV SOLN: Freq: Once | INTRAVENOUS | Status: AC

## 2022-11-10 MED ORDER — SODIUM CHLORIDE 0.9 % IV SOLN
1.0000 g | Freq: Once | INTRAVENOUS | Status: AC
  Administered 2022-11-10: 1 g via INTRAVENOUS
  Filled 2022-11-10: qty 10

## 2022-11-10 MED ORDER — IPRATROPIUM-ALBUTEROL 0.5-2.5 (3) MG/3ML IN SOLN
3.0000 mL | Freq: Four times a day (QID) | RESPIRATORY_TRACT | Status: DC | PRN
  Administered 2022-11-11: 3 mL via RESPIRATORY_TRACT
  Filled 2022-11-10: qty 3

## 2022-11-10 MED ORDER — ACETAMINOPHEN 325 MG PO TABS
650.0000 mg | ORAL_TABLET | Freq: Four times a day (QID) | ORAL | Status: DC | PRN
  Administered 2022-11-10 – 2022-11-12 (×7): 650 mg via ORAL
  Filled 2022-11-10 (×7): qty 2

## 2022-11-10 MED ORDER — GABAPENTIN 100 MG PO CAPS
200.0000 mg | ORAL_CAPSULE | Freq: Every day | ORAL | Status: DC
  Administered 2022-11-10 – 2022-11-11 (×2): 200 mg via ORAL
  Filled 2022-11-10 (×2): qty 2

## 2022-11-10 MED ORDER — SODIUM CHLORIDE 0.9 % IV SOLN
500.0000 mg | INTRAVENOUS | Status: DC
  Administered 2022-11-11 – 2022-11-12 (×2): 500 mg via INTRAVENOUS
  Filled 2022-11-10 (×2): qty 5

## 2022-11-10 MED ORDER — SODIUM CHLORIDE 0.9 % IV SOLN
2.0000 g | INTRAVENOUS | Status: DC
  Administered 2022-11-11 – 2022-11-12 (×2): 2 g via INTRAVENOUS
  Filled 2022-11-10 (×3): qty 20

## 2022-11-10 MED ORDER — FLUTICASONE PROPIONATE 50 MCG/ACT NA SUSP
2.0000 | Freq: Every day | NASAL | Status: DC | PRN
  Filled 2022-11-10: qty 16

## 2022-11-10 MED ORDER — ZOLPIDEM TARTRATE 5 MG PO TABS
10.0000 mg | ORAL_TABLET | Freq: Every day | ORAL | Status: DC
  Administered 2022-11-10 – 2022-11-11 (×2): 10 mg via ORAL
  Filled 2022-11-10 (×2): qty 2

## 2022-11-10 MED ORDER — ORAL CARE MOUTH RINSE: 15.0000 mL | OROMUCOSAL | Status: DC | PRN

## 2022-11-10 MED ORDER — MELATONIN 5 MG PO TABS
5.0000 mg | ORAL_TABLET | Freq: Every day | ORAL | Status: DC
  Administered 2022-11-10 – 2022-11-11 (×2): 5 mg via ORAL
  Filled 2022-11-10 (×2): qty 1

## 2022-11-10 MED ORDER — IOHEXOL 350 MG/ML SOLN
75.0000 mL | Freq: Once | INTRAVENOUS | Status: AC | PRN
  Administered 2022-11-10: 75 mL via INTRAVENOUS

## 2022-11-10 MED ORDER — SODIUM CHLORIDE 0.9 % IV SOLN
500.0000 mg | Freq: Once | INTRAVENOUS | Status: AC
  Administered 2022-11-10: 500 mg via INTRAVENOUS
  Filled 2022-11-10: qty 5

## 2022-11-10 MED ORDER — LOSARTAN POTASSIUM 50 MG PO TABS
25.0000 mg | ORAL_TABLET | Freq: Every day | ORAL | Status: DC
  Administered 2022-11-10 – 2022-11-12 (×3): 25 mg via ORAL
  Filled 2022-11-10 (×3): qty 1

## 2022-11-10 MED ORDER — PNEUMOCOCCAL 20-VAL CONJ VACC 0.5 ML IM SUSY
0.5000 mL | PREFILLED_SYRINGE | INTRAMUSCULAR | Status: DC
  Filled 2022-11-10: qty 0.5

## 2022-11-10 MED ORDER — ORAL CARE MOUTH RINSE
15.0000 mL | OROMUCOSAL | Status: DC
  Administered 2022-11-10 – 2022-11-12 (×5): 15 mL via OROMUCOSAL

## 2022-11-10 MED ORDER — HYDRALAZINE HCL 20 MG/ML IJ SOLN
10.0000 mg | Freq: Three times a day (TID) | INTRAMUSCULAR | Status: DC | PRN
  Administered 2022-11-10 – 2022-11-11 (×3): 10 mg via INTRAVENOUS
  Filled 2022-11-10 (×3): qty 1

## 2022-11-10 MED ORDER — ONDANSETRON HCL 4 MG PO TABS: 4.0000 mg | ORAL_TABLET | Freq: Four times a day (QID) | ORAL | Status: DC | PRN

## 2022-11-10 MED ORDER — LORAZEPAM 2 MG/ML IJ SOLN
1.0000 mg | Freq: Once | INTRAMUSCULAR | Status: AC
  Administered 2022-11-10: 1 mg via INTRAVENOUS
  Filled 2022-11-10: qty 1

## 2022-11-10 MED ORDER — METOPROLOL TARTRATE 5 MG/5ML IV SOLN
5.0000 mg | Freq: Four times a day (QID) | INTRAVENOUS | Status: DC | PRN
  Administered 2022-11-10 – 2022-11-12 (×4): 5 mg via INTRAVENOUS
  Filled 2022-11-10 (×4): qty 5

## 2022-11-10 MED ORDER — ACETAMINOPHEN 650 MG RE SUPP: 650.0000 mg | Freq: Four times a day (QID) | RECTAL | Status: DC | PRN

## 2022-11-10 MED ORDER — CHLORHEXIDINE GLUCONATE CLOTH 2 % EX PADS
6.0000 | MEDICATED_PAD | Freq: Every day | CUTANEOUS | Status: DC
  Administered 2022-11-10 – 2022-11-12 (×3): 6 via TOPICAL

## 2022-11-10 MED ORDER — GUAIFENESIN ER 600 MG PO TB12
1200.0000 mg | ORAL_TABLET | Freq: Two times a day (BID) | ORAL | Status: DC
  Administered 2022-11-10 – 2022-11-11 (×3): 1200 mg via ORAL
  Filled 2022-11-10 (×3): qty 2

## 2022-11-10 MED ORDER — INFLUENZA VAC SPLIT QUAD 0.5 ML IM SUSY
0.5000 mL | PREFILLED_SYRINGE | INTRAMUSCULAR | Status: AC
  Administered 2022-11-12: 0.5 mL via INTRAMUSCULAR
  Filled 2022-11-10: qty 0.5

## 2022-11-10 MED ORDER — ONDANSETRON HCL 4 MG/2ML IJ SOLN: 4.0000 mg | Freq: Four times a day (QID) | INTRAMUSCULAR | Status: DC | PRN

## 2022-11-10 NOTE — H&P (Signed)
History and Physical    Patient: Samuel West [id number] DOB: 08/10/1961 DOA: 11/09/2022 DOS: the patient was seen and examined on 11/10/2022 PCP: Roseanna Rainbow, PA-C (Inactive)  Patient coming from: Home  Chief Complaint:  Chief Complaint  Patient presents with   Shortness of Breath   HPI: Samuel West is a 61 y.o. male with medical history significant of asthma, factor V leiden, gout. Presenting with 1 week of shortness of breath. He reports that he had recent travel to [place]. During his stay, he had high fevers and shortness of breath. He had productive cough. He didn't have any sick contacts that was apparent to him. When he came home Monday, he went to urgent care to be assessed. He was notified that he was positive for flu and was given a steroid taper. Despite taking this regimen, he states he has not improved. When his symptoms worsened last night, he decided to come tot he ED for evaluation. He denies any other aggravating or alleviating factors.    Review of Systems: As mentioned in the history of present illness. All other systems reviewed and are negative. Past Medical History:  Diagnosis Date   Brain aneurysm    Factor 5 Leiden mutation, heterozygous (HCC)    Gout    History of kidney stones    History reviewed. No pertinent surgical history. Social History:  has no history on file for tobacco use, alcohol use, and drug use.  Allergies  Allergen Reactions   Morphine And Related Nausea And Vomiting    Family History  Problem Relation Age of Onset   Throat cancer Father     Prior to Admission medications   Medication Sig Start Date End Date Taking? Authorizing Provider  allopurinol (ZYLOPRIM) 300 MG tablet Take 300 mg by mouth as needed. 07/03/18  Yes [provider]  cyanocobalamin (,VITAMIN B-12,) 1000 MCG/ML injection Inject 1,000 mcg into the muscle every 30 (thirty) days. 09/22/21  Yes [provider]  cyclobenzaprine (FLEXERIL) 10  MG tablet Take 10 mg by mouth 2 (two) times daily as needed for muscle spasms. 11/29/21  Yes [provider]  fluticasone (FLONASE) 50 MCG/ACT nasal spray Place 2 sprays into both nostrils daily as needed for allergies. 05/07/18  Yes [provider]  gabapentin (NEURONTIN) 100 MG capsule Take 1 capsule at bedtime for one week, then 2 capsules at bedtime. 12/02/21  Yes Jaffe, Adam R, DO  guaiFENesin-codeine 100-10 MG/5ML syrup Take 5 mLs by mouth every 4 (four) hours as needed for cough. 11/06/22  Yes [provider]  ibuprofen (ADVIL) 200 MG tablet Take 800 mg by mouth every 8 (eight) hours as needed for mild pain. 07/03/17  Yes [provider]  predniSONE (STERAPRED UNI-PAK 21 TAB) 10 MG (21) TBPK tablet See admin instructions. 09/08/21  Yes [provider]  zolpidem (AMBIEN) 10 MG tablet Take 10 mg by mouth at bedtime. 11/09/21  Yes [provider]  albuterol (PROAIR HFA) 108 (90 Base) MCG/ACT inhaler Inhale 2 puffs into the lungs every 4 (four) hours as needed for wheezing or shortness of breath.    [provider]  ondansetron (ZOFRAN-ODT) 4 MG disintegrating tablet Take 1 tablet (4 mg total) by mouth every 8 (eight) hours as needed for nausea or vomiting. 12/02/21   Drema Dallas, DO    Physical Exam: Vitals:   11/10/22 0600 11/10/22 0623 11/10/22 0700 11/10/22 0905  BP: (!) 171/103  (!) 168/111   Pulse: 99 (!) 117 Marland Kitchen)  101   Resp: (!) 28 (!) 32 (!) 30   Temp:  98.5 F (36.9 C)  98.6 F (37 C)  TempSrc:  Oral  Oral  SpO2: 96% 96% 95%   Weight:      Height:       General: 61 y.o. male resting in bed in NAD Eyes: normal sclera ENMT: Nares patent w/o discharge, orophaynx clear, dentition normal, ears w/o discharge/lesions/ulcers Neck: Supple, trachea midline Cardiovascular: RRR, +S1, S2, no m/g/r, equal pulses throughout Respiratory: tachypneic , scattered rhonchi, increased WOB GI: BS+, NDNT, no masses noted, no organomegaly  noted MSK: No e/c/c Neuro: A&O x 3, no focal deficits Psyc: Appropriate interaction and affect, calm/cooperative  Data Reviewed:  Results for orders placed or performed during the hospital encounter of 11/09/22 (from the past 24 hour(s))  Blood culture (routine x 2)     Status: None (Preliminary result)   Collection Time: 11/09/22 11:23 PM   Specimen: Right Antecubital; Blood  Result Value Ref Range   Specimen Description      RIGHT ANTECUBITAL BLOOD Performed at [facility], [address] [place], [place] [address]    Special Requests      BOTTLES DRAWN AEROBIC AND ANAEROBIC Blood Culture adequate volume Performed at [facility], [address] [place], [place] [address]    Culture PENDING    Report Status PENDING   CBC with Differential     Status: Abnormal   Collection Time: 11/09/22 11:27 PM  Result Value Ref Range   WBC 19.5 (H) 4.0 - 10.5 K/uL   RBC 5.33 4.22 - 5.81 MIL/uL   Hemoglobin 15.6 13.0 - 17.0 g/dL   HCT 69.6 78.9 - 38.1 %   MCV 87.2 80.0 - 100.0 fL   MCH 29.3 26.0 - 34.0 pg   MCHC 33.5 30.0 - 36.0 g/dL   RDW 01.7 51.0 - 25.8 %   Platelets 416 (H) 150 - 400 K/uL   nRBC 0.0 0.0 - 0.2 %   Neutrophils Relative % 79 %   Neutro Abs 15.6 (H) 1.7 - 7.7 K/uL   Lymphocytes Relative 8 %   Lymphs Abs 1.5 0.7 - 4.0 K/uL   Monocytes Relative 9 %   Monocytes Absolute 1.7 (H) 0.1 - 1.0 K/uL   Eosinophils Relative 0 %   Eosinophils Absolute 0.1 0.0 - 0.5 K/uL   Basophils Relative 1 %   Basophils Absolute 0.2 (H) 0.0 - 0.1 K/uL   Immature Granulocytes 3 %   Abs Immature Granulocytes 0.55 (H) 0.00 - 0.07 K/uL  Comprehensive metabolic panel     Status: Abnormal   Collection Time: 11/09/22 11:27 PM  Result Value Ref Range   Sodium 133 (L) 135 - 145 mmol/L   Potassium 3.7 3.5 - 5.1 mmol/L   Chloride 98 98 - 111 mmol/L   CO2 24 22 - 32 mmol/L   Glucose, Bld 117 (H) 70 - 99 mg/dL   BUN 14 8 - 23 mg/dL   Creatinine, Ser 5.27 0.61 - 1.24 mg/dL    Calcium 8.9 8.9 - 78.2 mg/dL   Total Protein 8.2 (H) 6.5 - 8.1 g/dL   Albumin 3.4 (L) 3.5 - 5.0 g/dL   AST 97 (H) 15 - 41 U/L   ALT 117 (H) 0 - 44 U/L   Alkaline Phosphatase 81 38 - 126 U/L   Total Bilirubin 1.1 0.3 - 1.2 mg/dL   GFR, Estimated >42 >35 mL/min   Anion gap 11  5 - 15  Lactic acid, plasma     Status: None   Collection Time: 11/09/22 11:28 PM  Result Value Ref Range   Lactic Acid, Venous 1.4 0.5 - 1.9 mmol/L  Resp panel by RT-PCR (RSV, Flu A&B, Covid) Peripheral     Status: None   Collection Time: 11/09/22 11:47 PM   Specimen: Peripheral; Nasal Swab  Result Value Ref Range   SARS Coronavirus 2 by RT PCR NEGATIVE NEGATIVE   Influenza A by PCR NEGATIVE NEGATIVE   Influenza B by PCR NEGATIVE NEGATIVE   Resp Syncytial Virus by PCR NEGATIVE NEGATIVE   CXR: Patchy airspace disease in the left lower lobe concerning for pneumonia.  Assessment and Plan: LLL PNA Acute hypoxic respiratory failure Hx of asthma     - admitted to inpt, SDU     - using HFNC right now; still seems uncomfortable with breathing     - check ABG, d-dimer     - COVID/Flu/RSV negative; strange that he was positive for flu on Monday but negative on our tests last night; repeat flu screen     - continue rocephin (@ 2g q24h given weight) and azithro     - add nebs and guaifenesin  Elevated LFTs     - check hepatitis panel     - check RUQ ab [place]  HTN     - denies history of HTN     - right now will have PRNs available  Hx of Factor V Leiden      - continue outpt follow up  Advance Care Planning:   Code Status: FULL  Consults: None  Family Communication: w/ wife at bedside  Severity of Illness: The appropriate patient status for this patient is INPATIENT. Inpatient status is judged to be reasonable and necessary in order to provide the required intensity of service to ensure the patient's safety. The patient's presenting symptoms, physical exam findings, and initial radiographic and laboratory  data in the context of their chronic comorbidities is felt to place them at high risk for further clinical deterioration. Furthermore, it is not anticipated that the patient will be medically stable for discharge from the hospital within 2 midnights of admission.   * I certify that at the point of admission it is my clinical judgment that the patient will require inpatient hospital care spanning beyond 2 midnights from the point of admission due to high intensity of service, high risk for further deterioration and high frequency of surveillance required.*  Author: Teddy Spike, DO 11/10/2022 9:18 AM  For on call review [email or web address].

## 2022-11-10 NOTE — Progress Notes (Signed)
BP consistently greater than 170 with multiple cuff changes and site changes. Kyle MD notified each time BP remained elevated after medication intervention. RN instructed to monitor and at this time SBP under 180 is okay.

## 2022-11-10 NOTE — Patient Instructions (Signed)
Instructed patient on the proper use of a flutter valve. Patient demonstrated proper use of the device without difficulty and instructed patient to use 10 times Q hr. Patient tolerated well.

## 2022-11-11 LAB — BLOOD CULTURE ID PANEL (REFLEXED) - BCID2

## 2022-11-11 LAB — LEGIONELLA PNEUMOPHILA SEROGP 1 UR AG: L. pneumophila Serogp 1 Ur Ag: NEGATIVE

## 2022-11-11 LAB — COMPREHENSIVE METABOLIC PANEL
ALT: 68 U/L — ABNORMAL HIGH (ref 0–44)
AST: 39 U/L (ref 15–41)
Albumin: 2.4 g/dL — ABNORMAL LOW (ref 3.5–5.0)
Alkaline Phosphatase: 61 U/L (ref 38–126)
Anion gap: 10 (ref 5–15)
BUN: 14 mg/dL (ref 8–23)
CO2: 23 mmol/L (ref 22–32)
Calcium: 8.3 mg/dL — ABNORMAL LOW (ref 8.9–10.3)
Chloride: 101 mmol/L (ref 98–111)
Creatinine, Ser: 0.82 mg/dL (ref 0.61–1.24)
GFR, Estimated: >60 mL/min (ref >60)
Glucose, Bld: 100 mg/dL — ABNORMAL HIGH (ref 70–99)
Potassium: 3.6 mmol/L (ref 3.5–5.1)
Sodium: 134 mmol/L — ABNORMAL LOW (ref 135–145)
Total Bilirubin: 0.8 mg/dL (ref 0.3–1.2)
Total Protein: 6.2 g/dL — ABNORMAL LOW (ref 6.5–8.1)

## 2022-11-11 LAB — CBC
HCT: 40.2 % (ref 39.0–52.0)
Hemoglobin: 13.2 g/dL (ref 13.0–17.0)
MCH: 29.5 pg (ref 26.0–34.0)
MCHC: 32.8 g/dL (ref 30.0–36.0)
MCV: 89.9 fL (ref 80.0–100.0)
Platelets: 394 10*3/uL (ref 150–400)
RBC: 4.47 MIL/uL (ref 4.22–5.81)
RDW: 13.9 % (ref 11.5–15.5)
WBC: 17.4 10*3/uL — ABNORMAL HIGH (ref 4.0–10.5)
nRBC: 0 % (ref 0.0–0.2)

## 2022-11-11 LAB — STREP PNEUMONIAE URINARY ANTIGEN: Strep Pneumo Urinary Antigen: NEGATIVE

## 2022-11-11 MED ORDER — SODIUM CHLORIDE 0.9 % IV SOLN: INTRAVENOUS | Status: DC

## 2022-11-11 MED ORDER — GUAIFENESIN ER 600 MG PO TB12
600.0000 mg | ORAL_TABLET | Freq: Two times a day (BID) | ORAL | Status: DC
  Administered 2022-11-11 – 2022-11-12 (×2): 600 mg via ORAL
  Filled 2022-11-11 (×2): qty 1

## 2022-11-11 MED ORDER — IPRATROPIUM-ALBUTEROL 0.5-2.5 (3) MG/3ML IN SOLN
3.0000 mL | Freq: Four times a day (QID) | RESPIRATORY_TRACT | Status: DC
  Administered 2022-11-11 (×3): 3 mL via RESPIRATORY_TRACT
  Filled 2022-11-11 (×3): qty 3

## 2022-11-11 MED ORDER — DEXTROMETHORPHAN POLISTIREX ER 30 MG/5ML PO SUER
30.0000 mg | Freq: Two times a day (BID) | ORAL | Status: DC
  Administered 2022-11-11 (×2): 30 mg via ORAL
  Filled 2022-11-11 (×3): qty 5

## 2022-11-11 NOTE — Progress Notes (Signed)
PHARMACY - PHYSICIAN COMMUNICATION CRITICAL VALUE ALERT - BLOOD CULTURE IDENTIFICATION (BCID)  Samuel West is an 61 y.o. male who presented to [facility] on 11/09/2022 with pneumonia.  Assessment:  BCID + for Staph epidermidis (no methicillin resistance detected) in 1 out of 4 bottles; likely a contaminant.    Name of physician (or Provider) Contacted: Anthoney Harada, FNP  Current antibiotics: Ceftriaxone/Azithromycin for CAP  Changes to prescribed antibiotics recommended:  Patient is on recommended antibiotics - No changes needed  Results for orders placed or performed during the hospital encounter of 11/09/22  Blood Culture ID Panel (Reflexed) (Collected: 11/09/2022 11:23 PM)  Result Value Ref Range   Enterococcus faecalis NOT DETECTED NOT DETECTED   Enterococcus Faecium NOT DETECTED NOT DETECTED   Listeria monocytogenes NOT DETECTED NOT DETECTED   Staphylococcus species DETECTED (A) NOT DETECTED   Staphylococcus aureus (BCID) NOT DETECTED NOT DETECTED   Staphylococcus epidermidis DETECTED (A) NOT DETECTED   Staphylococcus lugdunensis NOT DETECTED NOT DETECTED   Streptococcus species NOT DETECTED NOT DETECTED   Streptococcus agalactiae NOT DETECTED NOT DETECTED   Streptococcus pneumoniae NOT DETECTED NOT DETECTED   Streptococcus pyogenes NOT DETECTED NOT DETECTED   A.calcoaceticus-baumannii NOT DETECTED NOT DETECTED   Bacteroides fragilis NOT DETECTED NOT DETECTED   Enterobacterales NOT DETECTED NOT DETECTED   Enterobacter cloacae complex NOT DETECTED NOT DETECTED   Escherichia coli NOT DETECTED NOT DETECTED   Klebsiella aerogenes NOT DETECTED NOT DETECTED   Klebsiella oxytoca NOT DETECTED NOT DETECTED   Klebsiella pneumoniae NOT DETECTED NOT DETECTED   Proteus species NOT DETECTED NOT DETECTED   Salmonella species NOT DETECTED NOT DETECTED   Serratia marcescens NOT DETECTED NOT DETECTED   Haemophilus influenzae NOT DETECTED NOT DETECTED   Neisseria meningitidis NOT  DETECTED NOT DETECTED   Pseudomonas aeruginosa NOT DETECTED NOT DETECTED   Stenotrophomonas maltophilia NOT DETECTED NOT DETECTED   Candida albicans NOT DETECTED NOT DETECTED   Candida auris NOT DETECTED NOT DETECTED   Candida glabrata NOT DETECTED NOT DETECTED   Candida krusei NOT DETECTED NOT DETECTED   Candida parapsilosis NOT DETECTED NOT DETECTED   Candida tropicalis NOT DETECTED NOT DETECTED   Cryptococcus neoformans/gattii NOT DETECTED NOT DETECTED   Methicillin resistance mecA/C NOT DETECTED NOT DETECTED    Maryellen Pile, PharmD 11/11/2022  9:55 PM

## 2022-11-11 NOTE — TOC Initial Note (Signed)
Transition of Care [facility]) - Initial/Assessment Note    Patient Details  Name: Samuel West MRN: [id number] Date of Birth: February 13, 1961  [facility] CM/SW Contact:    Lavenia Atlas, RN Phone Number: 11/11/2022, 5:18 PM  Clinical Narrative:  Patient from home with wife [name], who reports prior to admission patient does not have any DME or HH in place. No current TOC needs.   TOC will continue to follow for discharge needs.                    Expected Discharge Plan: Home/Self Care Barriers to Discharge: Continued Medical Work up   Patient Goals and CMS Choice Patient states their goals for this hospitalization and ongoing recovery are:: return home   Choice offered to / list presented to : NA      Expected Discharge Plan and Services In-house Referral: NA Discharge Planning Services: CM Consult   Living arrangements for the past 2 months: Single Family Home                 DME Arranged: N/A DME Agency: NA       HH Arranged: NA HH Agency: NA        Prior Living Arrangements/Services Living arrangements for the past 2 months: Single Family Home Lives with:: Spouse Patient language and need for interpreter reviewed:: Yes Do you feel safe going back to the place where you live?: Yes      Need for Family Participation in Patient Care: No (Comment) Care giver support system in place?: Yes (comment)   Criminal Activity/Legal Involvement Pertinent to Current Situation/Hospitalization: No - Comment as needed  Activities of Daily Living Home Assistive Devices/Equipment: Eyeglasses ADL Screening (condition at time of admission) Patient's cognitive ability adequate to safely complete daily activities?: Yes Is the patient deaf or have difficulty hearing?: Yes (R ear) Does the patient have difficulty seeing, even when wearing glasses/contacts?: No Does the patient have difficulty concentrating, remembering, or making decisions?: No Patient able to express need  for assistance with ADLs?: Yes Does the patient have difficulty dressing or bathing?: No Independently performs ADLs?: Yes (appropriate for developmental age) Does the patient have difficulty walking or climbing stairs?: No Weakness of Legs: None Weakness of Arms/Hands: None  Permission Sought/Granted Permission sought to share information with : Case Manager Permission granted to share information with : Yes, Verbal Permission Granted  Share Information with NAME: Case Manager           Emotional Assessment Appearance:: Appears stated age Attitude/Demeanor/Rapport: Gracious Affect (typically observed): Accepting Orientation: : Oriented to Self, Oriented to Place, Oriented to  Time, Oriented to Situation Alcohol / Substance Use: Other (comment) Psych Involvement: No (comment)  Admission diagnosis:  Hypoxia [R09.02] CAP (community acquired pneumonia) [J18.9] Community acquired pneumonia of left lower lobe of lung [J18.9] Patient Active Problem List   Diagnosis Date Noted   CAP (community acquired pneumonia) 11/10/2022   HTN (hypertension) 11/10/2022   Elevated LFTs 11/10/2022   Asthma, chronic 11/10/2022   Factor V Leiden (HCC) 11/10/2022   Acute respiratory failure with hypoxia (HCC) 11/10/2022   PCP:  Shirlyn Goltz (Inactive) Pharmacy:   [facility] [place], [place] - [address] [place] [address] Phone: [phone] Fax: [phone]     Social Determinants of Health (SDOH) Social History: SDOH Screenings   Food Insecurity: No Food Insecurity (11/10/2022)  Housing: Low Risk  (11/10/2022)  Transportation Needs: No Transportation Needs (11/10/2022)  Utilities: Not At Risk (11/10/2022)  Tobacco Use: Low Risk  (11/10/2022)   SDOH Interventions:     Readmission Risk Interventions     No data to display

## 2022-11-11 NOTE — Progress Notes (Addendum)
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, blood pressure, patient diagnosed with factor V Leiden after stroke diagnosed with atrial influenza ~12/15 Did not receive any treatment for this-had fever body aches chills etc.--2/2 worsening of symptoms with difficulty breathing hypoxia found to also be febrile tachycardic  Evaluation on admission revealed white count 22, BUN/creatinine 14/0.7 sodium 132 Hepatitis panel nonreactive respiratory viral panel negative CT angiogram chest = patchy peripheral infiltrates both lungs left upper and right apical lobe, fatty infiltration of the liver US ABD = layering hyperechoic discharge for possible 1.9 metaglip stone in gallbladder no evidence of cholecystitis CBD not visualized  Patient admitted with working diagnosis of multifocal PNA with respiratory failure-blood culture pending  Hospital-Problem based course  Multifocal pneumonia with acute hypoxic respiratory failure, RVP negative -Continue azithromycin and ceftriaxone and can narrow probably in 48 hours to oral meds - For completion sake get strep pneumo Legionella + follow blood culture -Add Delsym to meds to suppress cough-would cut back dose of Mucinex a little bit add flutter valve -Does not have appetite although drinking I would place on fluids for 24 hours and reassess - Try to de-escalate oxygen from high flow and keep on stepdown unit today given new oxygen requirement with severe respiratory distress on admission  Recent flu infection -After first 48 hours usually Tamiflu not indicated-hold, hold steroids - Would keep on flu precautions and droplet precautions for now and discontinue  in 48 hours  SAH status post rupture  status postrepair 2006 with cervicogenic headaches O-utpatient follow-up -At this time continue gabapentin 200 at bedtime, Flexeril 10 twice daily as needed spasm which her home  Benign pituitary mass diagnosed 08/2021 as above - Outpatient follow-up again with Dr. Everlena Cooper of neurology  Coincidental diagnosis possible fatty liver - Outpatient follow-up and dedicated scan, will need counseling about diet and weight loss at that time if needed  DVT prophylaxis: Lovenox Code Status: Full Family Communication: Wife at bedside Nevin Grizzle phone [phone] Disposition:  Status is: Inpatient Remains inpatient appropriate because: Because still has pneumonia and is sick     Consultants:  None  Procedures: No  Antimicrobials: Azithromycin and ceftriaxone   Subjective: Awake alert coughing quite a bit not hungry but is managing some fluids no chest pain no fever Still feels weak overall and debilitated No chest pain  Objective: Vitals:   11/11/22 0200 11/11/22 0300 11/11/22 0400 11/11/22 0500  BP: (!) 182/90 (!) 172/118 (!) 175/99 (!) 142/89  Pulse: 69 71 74 86  Resp: (!) 30 (!) 32 (!) 31 (!) 29  Temp:   98.6 F (37 C)   TempSrc:   Axillary   SpO2: 93% 96% (!) 87% 91%  Weight:      Height:        Intake/Output Summary (Last 24 hours) at 11/11/2022 7322 Last data filed at 11/11/2022 0400 Gross per 24 hour  Intake 350 ml  Output 2200 ml  Net -1850 ml   Filed Weights   11/09/22 2256  Weight: 108.9 kg    Examination:  EOMI NCAT no focal deficit no icterus no pallor  Does have some rales and rhonchi Abdomen is obese nontender no rebound No lower extremity edema ROM is intact without any focal deficit  Data Reviewed: personally reviewed  CBC    Component Value Date/Time   WBC 17.4 (H) 11/11/2022 0306   RBC 4.47 11/11/2022 0306   HGB 13.2 11/11/2022 0306   HCT 40.2 11/11/2022 0306   PLT 394 11/11/2022 0306   MCV 89.9 11/11/2022 0306   MCH 29.5 11/11/2022 0306    MCHC 32.8 11/11/2022 0306   RDW 13.9 11/11/2022 0306   LYMPHSABS 2.2 11/10/2022 1015   MONOABS 1.7 (H) 11/10/2022 1015   EOSABS 0.0 11/10/2022 1015   BASOSABS 0.1 11/10/2022 1015      Latest Ref Rng & Units 11/11/2022    3:06 AM 11/10/2022   10:15 AM 11/09/2022   11:27 PM  CMP  Glucose 70 - 99 mg/dL 782  98  956   BUN 8 - 23 mg/dL 14  14  14    Creatinine 0.61 - 1.24 mg/dL  2.13  0.86   Sodium 135 - 145 mmol/L 134  132  133   Potassium 3.5 - 5.1 mmol/L 3.6  3.6  3.7   Chloride 98 - 111 mmol/L 101  100  98   CO2 22 - 32 mmol/L 23  22  24    Calcium 8.9 - 10.3 mg/dL 8.3  8.5  8.9   Total Protein 6.5 - 8.1 g/dL 6.2  7.5  8.2   Total Bilirubin 0.3 - 1.2 mg/dL 0.8  1.0  1.1   Alkaline Phos 38 - 126 U/L 61  71  81   AST 15 - 41 U/L 39  65  97   ALT 0 - 44 U/L 68  95  117      Radiology Studies: 5.78 Abdomen Limited RUQ (LIVER/GB)  Result Date: 11/10/2022 CLINICAL DATA:  Elevated LFTs EXAM: ULTRASOUND ABDOMEN LIMITED RIGHT UPPER QUADRANT COMPARISON:  None Available. FINDINGS: Gallbladder: Nondilated gallbladder. There is a layering hyperechoic sludge or 1.9 cm ovoid stone in the gallbladder. No wall thickening or pericholecystic fluid. Negative sonographic Murphy sign. Common bile duct: Not visualized. Liver: Diffusely increased liver echogenicity. No focal lesion identified. Portal vein is patent on color Doppler imaging with normal direction of blood flow towards the liver. Other: Limited exam due to patient condition. IMPRESSION: Limited exam due to patient condition. Hepatic steatosis. Layering hyperechoic sludge or 1.9 cm ovoid stone in the gallbladder. No evidence of cholecystitis.  Common bile duct is not visualized. Electronically Signed   By: [place] M.D.   On: 11/10/2022 13:24   CT Angio Chest Pulmonary Embolism (PE) W or WO Contrast  Result Date: 11/10/2022 CLINICAL DATA:  Shortness of breath, fever, high clinical suspicion of pulmonary embolism, history of factor V  Leiden deficiency EXAM: CT ANGIOGRAPHY CHEST WITH CONTRAST TECHNIQUE: Multidetector CT imaging of the chest was performed using the standard protocol during bolus administration of intravenous contrast. Multiplanar CT image reconstructions and MIPs were obtained to evaluate the vascular anatomy. RADIATION DOSE REDUCTION: This exam was performed according to the departmental dose-optimization program which includes automated exposure control, adjustment of the mA and/or kV according to patient size and/or use of iterative reconstruction technique. CONTRAST:  31mL OMNIPAQUE IOHEXOL 350 MG/ML SOLN IV COMPARISON:  None Available. FINDINGS: Cardiovascular: Aorta normal caliber without aneurysm or dissection. Heart size normal. Mild coronary arterial calcifications noted. No pericardial effusion. Assessment of pulmonary arteries is significantly limited by respiratory motion. No definite large central pulmonary emboli are seen. Unable to exclude smaller and peripheral emboli by this exam. Mediastinum/Nodes: Esophagus unremarkable. Base of cervical region normal appearance. No adenopathy. Lungs/Pleura: Patchy peripheral  infiltrates in both lungs greater in LEFT upper lobe and RIGHT apex. Cannot exclude COVID-19 with this appearance. No pleural effusion or pneumothorax. Upper Abdomen: Fatty infiltration of liver. Nonobstructing upper pole LEFT renal calculi. Musculoskeletal: Chronic appearing height loss of T12 vertebral body. No acute osseous findings. Review of the MIP images confirms the above findings. IMPRESSION: Assessment of pulmonary arteries is significantly limited by respiratory motion. No definite large central pulmonary emboli are seen. Unable to exclude smaller and peripheral emboli by this exam. Patchy peripheral infiltrates in both lungs greater in LEFT upper lobe and RIGHT apex. Cannot exclude COVID-19 with this appearance. Fatty infiltration of liver. Nonobstructing upper pole LEFT renal calculi.  Electronically Signed   By: Ulyses Southward M.D.   On: 11/10/2022 12:39   DG Chest 2 View  Result Date: 11/10/2022 CLINICAL DATA:  Shortness of breath EXAM: CHEST - 2 VIEW COMPARISON:  Chest x-ray 05/21/2018 FINDINGS: There is patchy airspace disease in the left lower lobe. There is no pleural effusion or pneumothorax. The cardiomediastinal silhouette is within normal limits. No acute fractures are seen. IMPRESSION: Patchy airspace disease in the left lower lobe concerning for pneumonia. Electronically Signed   By: Darliss Cheney M.D.   On: 11/10/2022 00:00     Scheduled Meds:  Chlorhexidine Gluconate Cloth  6 each Topical Daily   enoxaparin (LOVENOX) injection  40 mg Subcutaneous Q24H   gabapentin  200 mg Oral QHS   guaiFENesin  1,200 mg Oral BID   influenza vac split quadrivalent PF  0.5 mL Intramuscular Tomorrow-1000   ipratropium-albuterol  3 mL Nebulization Q6H   losartan  25 mg Oral Daily   melatonin  5 mg Oral QHS   mouth rinse  15 mL Mouth Rinse 4 times per day   pneumococcal 20-valent conjugate vaccine  0.5 mL Intramuscular Tomorrow-1000   zolpidem  10 mg Oral QHS   Continuous Infusions:  azithromycin Stopped (11/11/22 0326)   cefTRIAXone (ROCEPHIN)  IV Stopped (11/11/22 0146)     LOS: 1 day   Time spent: 27  Rhetta Mura, MD [facility] To contact the attending provider between 7A-7P or the covering provider during after hours 7P-7A, please log into the web site [email or web address] and access using universal [place] password for that web site. If you do not have the password, please call the hospital operator.  11/11/2022, 7:21 AM

## 2022-11-12 ENCOUNTER — Encounter (HOSPITAL_COMMUNITY): Payer: Self-pay | Admitting: Internal Medicine

## 2022-11-12 DIAGNOSIS — I10 Essential (primary) hypertension: Secondary | ICD-10-CM

## 2022-11-12 DIAGNOSIS — J9601 Acute respiratory failure with hypoxia: Secondary | ICD-10-CM

## 2022-11-12 MED ORDER — IPRATROPIUM-ALBUTEROL 0.5-2.5 (3) MG/3ML IN SOLN
3.0000 mL | Freq: Three times a day (TID) | RESPIRATORY_TRACT | Status: DC
  Administered 2022-11-12: 3 mL via RESPIRATORY_TRACT

## 2022-11-12 MED ORDER — HYDRALAZINE HCL 20 MG/ML IJ SOLN
10.0000 mg | Freq: Four times a day (QID) | INTRAMUSCULAR | Status: DC | PRN
  Administered 2022-11-12: 10 mg via INTRAVENOUS
  Filled 2022-11-12: qty 1

## 2022-11-12 MED ORDER — CEFDINIR 300 MG PO CAPS: 300.0000 mg | ORAL_CAPSULE | Freq: Two times a day (BID) | ORAL | 0 refills | Status: AC

## 2022-11-12 MED ORDER — DEXTROMETHORPHAN POLISTIREX ER 30 MG/5ML PO SUER: 30.0000 mg | Freq: Two times a day (BID) | ORAL | 0 refills | Status: AC

## 2022-11-12 MED ORDER — GUAIFENESIN ER 600 MG PO TB12: 600.0000 mg | ORAL_TABLET | Freq: Two times a day (BID) | ORAL | 0 refills | Status: AC

## 2022-11-12 MED ORDER — HYDROCOD POLI-CHLORPHE POLI ER 10-8 MG/5ML PO SUER: 5.0000 mL | Freq: Two times a day (BID) | ORAL | 0 refills | Status: DC | PRN

## 2022-11-12 MED ORDER — ACETAMINOPHEN 325 MG PO TABS: 650.0000 mg | ORAL_TABLET | Freq: Four times a day (QID) | ORAL | Status: DC | PRN

## 2022-11-12 MED ORDER — AZITHROMYCIN 250 MG PO TABS: 500.0000 mg | ORAL_TABLET | Freq: Every day | ORAL | Status: DC

## 2022-11-12 MED ORDER — LOSARTAN POTASSIUM 25 MG PO TABS: 25.0000 mg | ORAL_TABLET | Freq: Every day | ORAL | 2 refills | Status: AC

## 2022-11-12 MED ORDER — AZITHROMYCIN 500 MG PO TABS: 500.0000 mg | ORAL_TABLET | Freq: Every day | ORAL | 0 refills | Status: AC

## 2022-11-12 MED ORDER — HYDROCOD POLI-CHLORPHE POLI ER 10-8 MG/5ML PO SUER: 5.0000 mL | Freq: Two times a day (BID) | ORAL | Status: DC | PRN

## 2022-11-12 NOTE — Progress Notes (Addendum)
PHARMACIST - PHYSICIAN COMMUNICATION DR:   Denton Lank CONCERNING: Antibiotic IV to Oral Route Change Policy  RECOMMENDATION: This patient is receiving azithromycin by the intravenous route.  Based on criteria approved by the Pharmacy and Therapeutics Committee, the antibiotic(s) is/are being converted to the equivalent oral dose form(s).   DESCRIPTION: These criteria include: Patient being treated for a respiratory tract infection, urinary tract infection, cellulitis or clostridium difficile associated diarrhea if on metronidazole The patient is not neutropenic and does not exhibit a GI malabsorption state The patient is eating (either orally or via tube) and/or has been taking other orally administered medications for a least 24 hours The patient is improving clinically and has a Tmax < 100.5  If you have questions about this conversion, please contact the Pharmacy Department  []   [phone] )  ( [phone] []   [phone] )   []   [phone] )  [facility] [x]   [phone] )  [facility]   Thank you for allowing pharmacy to be a part of this patient's care.  [facility], PharmD, BCPS Clinical Pharmacist [place] Please utilize Amion for appropriate phone number to reach the unit pharmacist [facility]) 11/12/2022 10:31 AM

## 2022-11-12 NOTE — Discharge Summary (Signed)
Physician Discharge Summary   Patient: Samuel West MRN: [id number] DOB: 04-28-1961  Admit date:     11/09/2022  Discharge date: 11/12/22  Discharge Physician: Samuel West   PCP: Samuel Rainbow, PA-C (Inactive)   Recommendations at discharge:    Follow up with Primary Care in 1-2 weeks Repeat CBC, BMP, Mg in 1-2 weeks Follow up on recovery from pneumonia Follow up on blood pressure. Patient started on losartan during admission, continued at d/c due to elevated BP's in [place] setting.  Recommended home BP monitoring until follow up appointment.  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 diagnosed with atrial influenza ~12/15 Did not receive any treatment for this-had fever body aches chills etc.--2/2 worsening of symptoms with difficulty breathing hypoxia found to also be febrile tachycardic   Evaluation on admission revealed white count 22, BUN/creatinine 14/0.7 sodium 132 Hepatitis panel nonreactive respiratory viral panel negative CT angiogram chest = patchy peripheral infiltrates both lungs left upper and right apical lobe, fatty infiltration of the liver US ABD = layering hyperechoic discharge for possible 1.9 metaglip stone in gallbladder no evidence of cholecystitis CBD not visualized   Patient admitted with working diagnosis of multifocal PNA with respiratory failure.  Patient treated with IV antibiotics and responded with good clinical improvement.   Weaned off oxygen yesterday evening, stable O2 sats  overnight and this morning, including with ambulation. Medically stable for discharge home with 3 more days PO antibiotics and ongoing supportive care.  Advised close PCP follow up in 1-2 week.    Assessment and Plan:   Multifocal pneumonia with acute hypoxic respiratory failure, RVP negative Hypoxia has resolved.  Stable O2 sats overnight and this AM including with ambulation. Treated with IV azithromycin and ceftriaxone  Discharge with 3 more days oral equivalents Continue supportive care with cough medications, bronchodilator PRN.    Recent flu infection- complicated by secondary bacterial pneumonia.  Tamiflu not indicated given time since diagnosis.  Treat PNA as outlined above. Supportive care.   SAH status post rupture status postrepair 2006 with cervicogenic headaches No acute issues. Outpatient follow-up Continue gabapentin 200 at bedtime, Flexeril 10 twice daily as needed spasm which her home   Benign pituitary mass diagnosed 08/2021 as above - Outpatient follow-up again with Samuel West of neurology   Coincidental diagnosis possible fatty liver - Outpatient follow-up and dedicated scan, will need counseling about diet and weight loss at that time if needed       Consultants: None Procedures performed: None  Disposition: Home Diet recommendation:  Discharge Diet Orders (From admission, onward)     Start     Ordered   11/12/22 0000  Diet - low sodium heart healthy        11/12/22 1127           Cardiac diet DISCHARGE MEDICATION: Allergies as of 11/12/2022       Reactions   Morphine And Related Nausea And Vomiting        Medication List     STOP taking these medications    predniSONE 10 MG (21) Tbpk  tablet Commonly known as: STERAPRED UNI-PAK 21 TAB       TAKE these medications    acetaminophen 325 MG tablet Commonly known as: TYLENOL Take 2 tablets (650 mg total) by mouth every 6 (six) hours as needed for mild pain, fever or headache.    azithromycin 500 MG tablet Commonly known as: Zithromax Take 1 tablet (500 mg total) by mouth daily for 3 days. Take 1 tablet daily for 3 days.   cefdinir 300 MG capsule Commonly known as: OMNICEF Take 1 capsule (300 mg total) by mouth 2 (two) times daily for 3 days.   chlorpheniramine-HYDROcodone 10-8 MG/5ML Commonly known as: TUSSIONEX Take 5 mLs by mouth every 12 (twelve) hours as needed for cough.   cyanocobalamin 1000 MCG/ML injection Commonly known as: VITAMIN B12 Inject 1,000 mcg into the muscle every 30 (thirty) days.   cyclobenzaprine 10 MG tablet Commonly known as: FLEXERIL Take 10 mg by mouth 2 (two) times daily as needed for muscle spasms.   dextromethorphan 30 MG/5ML liquid Commonly known as: DELSYM Take 5 mLs (30 mg total) by mouth 2 (two) times daily for 7 days.   fluticasone 50 MCG/ACT nasal spray Commonly known as: FLONASE Place 2 sprays into both nostrils daily as needed for allergies.   gabapentin 100 MG capsule Commonly known as: Neurontin Take 1 capsule at bedtime for one week, then 2 capsules at bedtime. What changed:  how much to take when to take this   guaiFENesin 600 MG 12 hr tablet Commonly known as: MUCINEX Take 1 tablet (600 mg total) by mouth 2 (two) times daily for 7 days.   guaiFENesin-codeine 100-10 MG/5ML syrup Take 5 mLs by mouth every 4 (four) hours as needed for cough.   ibuprofen 200 MG tablet Commonly known as: ADVIL Take 800 mg by mouth every 8 (eight) hours as needed for mild pain.   losartan 25 MG tablet Commonly known as: COZAAR Take 1 tablet (25 mg total) by mouth daily. Start taking on: November 13, 2022   ProAir HFA 108 (90 Base) MCG/ACT inhaler Generic drug: albuterol Inhale 2 puffs into the lungs every 4 (four) hours as needed for wheezing or shortness of breath.   zolpidem 10 MG tablet Commonly known as: AMBIEN Take 10 mg by mouth at bedtime.   Zyloprim 300 MG tablet Generic drug: allopurinol Take 300 mg  by mouth as needed.        Discharge Exam: Filed Weights   11/09/22 2256  Weight: 108.9 kg   General exam: awake, alert, no acute distress HEENT: hoarse voice, moist mucus membranes, hearing grossly normal  Respiratory system: CTAB with diminished bases due to shallow inspirations, no expiratory wheezes, no rhonchi, normal respiratory effort at rest on room air. Cardiovascular system: normal S1/S2, RRR, no pedal edema.   Gastrointestinal system: soft, NT, ND, no HSM felt, +bowel sounds. Central nervous system: A&O x4. no gross focal neurologic deficits, normal speech Extremities: moves all, no edema, normal tone Skin: dry, intact, normal temperature, normal color, No rashes, lesions or ulcers Psychiatry: normal mood, congruent affect, judgement and insight appear normal   Condition at discharge: stable  The results of significant diagnostics from this hospitalization (including imaging, microbiology, ancillary and laboratory) are listed below for reference.   Imaging Studies: US Abdomen Limited RUQ (LIVER/GB)  Result Date: 11/10/2022 CLINICAL DATA:  Elevated LFTs EXAM: ULTRASOUND ABDOMEN LIMITED RIGHT UPPER QUADRANT COMPARISON:  None Available. FINDINGS: Gallbladder: Nondilated gallbladder. There is a layering hyperechoic sludge or 1.9 cm  ovoid stone in the gallbladder. No wall thickening or pericholecystic fluid. Negative sonographic Murphy sign. Common bile duct: Not visualized. Liver: Diffusely increased liver echogenicity. No focal lesion identified. Portal vein is patent on color Doppler imaging with normal direction of blood flow towards the liver. Other: Limited exam due to patient condition. IMPRESSION: Limited exam due to patient condition. Hepatic steatosis. Layering hyperechoic sludge or 1.9 cm ovoid stone in the gallbladder. No evidence of cholecystitis.  Common bile duct is not visualized. Electronically Signed   By: Caprice Renshaw M.D.   On: 11/10/2022 13:24   CT Angio  Chest Pulmonary Embolism (PE) W or WO Contrast  Result Date: 11/10/2022 CLINICAL DATA:  Shortness of breath, fever, high clinical suspicion of pulmonary embolism, history of factor V Leiden deficiency EXAM: CT ANGIOGRAPHY CHEST WITH CONTRAST TECHNIQUE: Multidetector CT imaging of the chest was performed using the standard protocol during bolus administration of intravenous contrast. Multiplanar CT image reconstructions and MIPs were obtained to evaluate the vascular anatomy. RADIATION DOSE REDUCTION: This exam was performed according to the departmental dose-optimization program which includes automated exposure control, adjustment of the mA and/or kV according to patient size and/or use of iterative reconstruction technique. CONTRAST:  75mL OMNIPAQUE IOHEXOL 350 MG/ML SOLN IV COMPARISON:  None Available. FINDINGS: Cardiovascular: Aorta normal caliber without aneurysm or dissection. Heart size normal. Mild coronary arterial calcifications noted. No pericardial effusion. Assessment of pulmonary arteries is significantly limited by respiratory motion. No definite large central pulmonary emboli are seen. Unable to exclude smaller and peripheral emboli by this exam. Mediastinum/Nodes: Esophagus unremarkable. Base of cervical region normal appearance. No adenopathy. Lungs/Pleura: Patchy peripheral infiltrates in both lungs greater in LEFT upper lobe and RIGHT apex. Cannot exclude COVID-19 with this appearance. No pleural effusion or pneumothorax. Upper Abdomen: Fatty infiltration of liver. Nonobstructing upper pole LEFT renal calculi. Musculoskeletal: Chronic appearing height loss of T12 vertebral body. No acute osseous findings. Review of the MIP images confirms the above findings. IMPRESSION: Assessment of pulmonary arteries is significantly limited by respiratory motion. No definite large central pulmonary emboli are seen. Unable to exclude smaller and peripheral emboli by this exam. Patchy peripheral infiltrates  in both lungs greater in LEFT upper lobe and RIGHT apex. Cannot exclude COVID-19 with this appearance. Fatty infiltration of liver. Nonobstructing upper pole LEFT renal calculi. Electronically Signed   By: Ulyses Southward M.D.   On: 11/10/2022 12:39   DG Chest 2 View  Result Date: 11/10/2022 CLINICAL DATA:  Shortness of breath EXAM: CHEST - 2 VIEW COMPARISON:  Chest x-ray 05/21/2018 FINDINGS: There is patchy airspace disease in the left lower lobe. There is no pleural effusion or pneumothorax. The cardiomediastinal silhouette is within normal limits. No acute fractures are seen. IMPRESSION: Patchy airspace disease in the left lower lobe concerning for pneumonia. Electronically Signed   By: Darliss Cheney M.D.   On: 11/10/2022 00:00    Microbiology: Results for orders placed or performed during the [facility] encounter of 11/09/22  Blood culture (routine x 2)     Status: Abnormal (Preliminary result)   Collection Time: 11/09/22 11:23 PM   Specimen: Right Antecubital; Blood  Result Value Ref Range Status   Specimen Description   Final    RIGHT ANTECUBITAL BLOOD Performed at [facility], [address], [place], [place] [address]    Special Requests   Final    BOTTLES DRAWN AEROBIC AND ANAEROBIC Blood Culture adequate volume Performed at [facility], [address] [place], [place] [address]    Culture  Setup Time   Final    AEROBIC BOTTLE ONLY GRAM POSITIVE COCCI IN CLUSTERS Organism ID to follow CRITICAL RESULT CALLED TO, READ BACK BY AND VERIFIED WITH:  C/ PHARMD L. POINDEXTER 11/11/22 2144 A. LAFRANCE    Culture (A)  Final    STAPHYLOCOCCUS EPIDERMIDIS THE SIGNIFICANCE OF ISOLATING THIS ORGANISM FROM A SINGLE SET OF BLOOD CULTURES WHEN MULTIPLE SETS ARE DRAWN IS UNCERTAIN. PLEASE NOTIFY THE MICROBIOLOGY DEPARTMENT WITHIN ONE WEEK IF SPECIATION AND SENSITIVITIES ARE REQUIRED. Performed at [facility], [address] [place], [place] [address]    Report Status  PENDING  Incomplete  Blood Culture ID Panel (Reflexed)     Status: Abnormal   Collection Time: 11/09/22 11:23 PM  Result Value Ref Range Status   Enterococcus faecalis NOT DETECTED NOT DETECTED Final   Enterococcus Faecium NOT DETECTED NOT DETECTED Final   Listeria monocytogenes NOT DETECTED NOT DETECTED Final   Staphylococcus species DETECTED (A) NOT DETECTED Final    Comment: CRITICAL RESULT CALLED TO, READ BACK BY AND VERIFIED WITH:  C/ PHARMD L. POINDEXTER 11/11/22 2144 A. LAFRANCE    Staphylococcus aureus (BCID) NOT DETECTED NOT DETECTED Final   Staphylococcus epidermidis DETECTED (A) NOT DETECTED Final    Comment: CRITICAL RESULT CALLED TO, READ BACK BY AND VERIFIED WITH:  C/ PHARMD L. POINDEXTER 11/11/22 2144 A. LAFRANCE    Staphylococcus lugdunensis NOT DETECTED NOT DETECTED Final   Streptococcus species NOT DETECTED NOT DETECTED Final   Streptococcus agalactiae NOT DETECTED NOT DETECTED Final   Streptococcus pneumoniae NOT DETECTED NOT DETECTED Final   Streptococcus pyogenes NOT DETECTED NOT DETECTED Final   A.calcoaceticus-baumannii NOT DETECTED NOT DETECTED Final   Bacteroides fragilis NOT DETECTED NOT DETECTED Final   Enterobacterales NOT DETECTED NOT DETECTED Final   Enterobacter cloacae complex NOT DETECTED NOT DETECTED Final   Escherichia coli NOT DETECTED NOT DETECTED Final   Klebsiella aerogenes NOT DETECTED NOT DETECTED Final   Klebsiella oxytoca NOT DETECTED NOT DETECTED Final   Klebsiella pneumoniae NOT DETECTED NOT DETECTED Final   Proteus species NOT DETECTED NOT DETECTED Final   Salmonella species NOT DETECTED NOT DETECTED Final   Serratia marcescens NOT DETECTED NOT DETECTED Final   Haemophilus influenzae NOT DETECTED NOT DETECTED Final   Neisseria meningitidis NOT DETECTED NOT DETECTED Final   Pseudomonas aeruginosa NOT DETECTED NOT DETECTED Final   Stenotrophomonas maltophilia NOT DETECTED NOT DETECTED Final   Candida albicans NOT DETECTED NOT DETECTED  Final   Candida auris NOT DETECTED NOT DETECTED Final   Candida glabrata NOT DETECTED NOT DETECTED Final   Candida krusei NOT DETECTED NOT DETECTED Final   Candida parapsilosis NOT DETECTED NOT DETECTED Final   Candida tropicalis NOT DETECTED NOT DETECTED Final   Cryptococcus neoformans/gattii NOT DETECTED NOT DETECTED Final   Methicillin resistance mecA/C NOT DETECTED NOT DETECTED Final    Comment: Performed at [facility], [address], [place], [place] [address]  Blood culture (routine x 2)     Status: None (Preliminary result)   Collection Time: 11/09/22 11:29 PM   Specimen: BLOOD LEFT WRIST  Result Value Ref Range Status   Specimen Description   Final    BLOOD LEFT WRIST Performed at [facility] [address] [place], [place] [address]    Special Requests   Final    BOTTLES DRAWN AEROBIC AND ANAEROBIC Blood Culture results may not be optimal due to an excessive  volume of blood received in culture bottles Performed at [facility], [address] [place], [place] [address]    Culture   Final    NO GROWTH 2 DAYS Performed at [facility], [address], [place], [place] [address]    Report Status PENDING  Incomplete  Resp panel by RT-PCR (RSV, Flu A&B, Covid) Peripheral     Status: None   Collection Time: 11/09/22 11:47 PM   Specimen: Peripheral; Nasal Swab  Result Value Ref Range Status   SARS Coronavirus 2 by RT PCR NEGATIVE NEGATIVE Final    Comment: (NOTE) SARS-CoV-2 target nucleic acids are NOT DETECTED.  The SARS-CoV-2 RNA is generally detectable in upper respiratory specimens during the acute phase of infection. The lowest concentration of SARS-CoV-2 viral copies this assay can detect is 138 copies/mL. A negative result does not preclude SARS-Cov-2 infection and should not be used as the sole basis for treatment or other patient management decisions. A negative result may occur with  improper specimen collection/handling,  submission of specimen other than nasopharyngeal swab, presence of viral mutation(s) within the areas targeted by this assay, and inadequate number of viral copies(<138 copies/mL). A negative result must be combined with clinical observations, patient history, and epidemiological information. The expected result is Negative.  Fact Sheet for Patients:  BloggerCourse.com  Fact Sheet for Healthcare Providers:  SeriousBroker.it  This test is no t yet approved or cleared by the [place] FDA and  has been authorized for detection and/or diagnosis of SARS-CoV-2 by FDA under an Emergency Use Authorization (EUA). This EUA will remain  in effect (meaning this test can be used) for the duration of the COVID-19 declaration under Section 564(b)(1) of the Act, 21 U.S.C.section 360bbb-3(b)(1), unless the authorization is terminated  or revoked sooner.       Influenza A by PCR NEGATIVE NEGATIVE Final   Influenza B by PCR NEGATIVE NEGATIVE Final    Comment: (NOTE) The Xpert Xpress SARS-CoV-2/FLU/RSV plus assay is intended as an aid in the diagnosis of influenza from Nasopharyngeal swab specimens and should not be used as a sole basis for treatment. Nasal washings and aspirates are unacceptable for Xpert Xpress SARS-CoV-2/FLU/RSV testing.  Fact Sheet for Patients: BloggerCourse.com  Fact Sheet for Healthcare Providers: SeriousBroker.it  This test is not yet approved or cleared by the [place] FDA and has been authorized for detection and/or diagnosis of SARS-CoV-2 by FDA under an Emergency Use Authorization (EUA). This EUA will remain in effect (meaning this test can be used) for the duration of the COVID-19 declaration under Section 564(b)(1) of the Act, 21 U.S.C. section 360bbb-3(b)(1), unless the authorization is terminated or revoked.     Resp Syncytial Virus by PCR NEGATIVE  NEGATIVE Final    Comment: (NOTE) Fact Sheet for Patients: BloggerCourse.com  Fact Sheet for Healthcare Providers: SeriousBroker.it  This test is not yet approved or cleared by the [place] FDA and has been authorized for detection and/or diagnosis of SARS-CoV-2 by FDA under an Emergency Use Authorization (EUA). This EUA will remain in effect (meaning this test can be used) for the duration of the COVID-19 declaration under Section 564(b)(1) of the Act, 21 U.S.C. section 360bbb-3(b)(1), unless the authorization is terminated or revoked.  Performed at [facility], [address] [place], [place] [address]   Resp panel by RT-PCR (RSV, Flu A&B, Covid) Anterior Nasal Swab     Status: None   Collection Time: 11/10/22 10:33 AM  Specimen: Anterior Nasal Swab  Result Value Ref Range Status   SARS Coronavirus 2 by RT PCR NEGATIVE NEGATIVE Final    Comment: (NOTE) SARS-CoV-2 target nucleic acids are NOT DETECTED.  The SARS-CoV-2 RNA is generally detectable in upper respiratory specimens during the acute phase of infection. The lowest concentration of SARS-CoV-2 viral copies this assay can detect is 138 copies/mL. A negative result does not preclude SARS-Cov-2 infection and should not be used as the sole basis for treatment or other patient management decisions. A negative result may occur with  improper specimen collection/handling, submission of specimen other than nasopharyngeal swab, presence of viral mutation(s) within the areas targeted by this assay, and inadequate number of viral copies(<138 copies/mL). A negative result must be combined with clinical observations, patient history, and epidemiological information. The expected result is Negative.  Fact Sheet for Patients:  BloggerCourse.comhttps://www.fda.gov/media/152166/download  Fact Sheet for Healthcare Providers:  SeriousBroker.ithttps://www.fda.gov/media/152162/download  This test is  no t yet approved or cleared by the [place] FDA and  has been authorized for detection and/or diagnosis of SARS-CoV-2 by FDA under an Emergency Use Authorization (EUA). This EUA will remain  in effect (meaning this test can be used) for the duration of the COVID-19 declaration under Section 564(b)(1) of the Act, 21 U.S.C.section 360bbb-3(b)(1), unless the authorization is terminated  or revoked sooner.       Influenza A by PCR NEGATIVE NEGATIVE Final   Influenza B by PCR NEGATIVE NEGATIVE Final    Comment: (NOTE) The Xpert Xpress SARS-CoV-2/FLU/RSV plus assay is intended as an aid in the diagnosis of influenza from Nasopharyngeal swab specimens and should not be used as a sole basis for treatment. Nasal washings and aspirates are unacceptable for Xpert Xpress SARS-CoV-2/FLU/RSV testing.  Fact Sheet for Patients: BloggerCourse.comhttps://www.fda.gov/media/152166/download  Fact Sheet for Healthcare Providers: SeriousBroker.ithttps://www.fda.gov/media/152162/download  This test is not yet approved or cleared by the [place] FDA and has been authorized for detection and/or diagnosis of SARS-CoV-2 by FDA under an Emergency Use Authorization (EUA). This EUA will remain in effect (meaning this test can be used) for the duration of the COVID-19 declaration under Section 564(b)(1) of the Act, 21 U.S.C. section 360bbb-3(b)(1), unless the authorization is terminated or revoked.     Resp Syncytial Virus by PCR NEGATIVE NEGATIVE Final    Comment: (NOTE) Fact Sheet for Patients: BloggerCourse.comhttps://www.fda.gov/media/152166/download  Fact Sheet for Healthcare Providers: SeriousBroker.ithttps://www.fda.gov/media/152162/download  This test is not yet approved or cleared by the [place] FDA and has been authorized for detection and/or diagnosis of SARS-CoV-2 by FDA under an Emergency Use Authorization (EUA). This EUA will remain in effect (meaning this test can be used) for the duration of the COVID-19 declaration under Section  564(b)(1) of the Act, 21 U.S.C. section 360bbb-3(b)(1), unless the authorization is terminated or revoked.  Performed at [facility], [address] [place], [place] [id number]   Respiratory (~20 pathogens) panel by PCR     Status: None   Collection Time: 11/10/22 10:33 AM   Specimen: Nasopharyngeal Swab; Respiratory  Result Value Ref Range Status   Adenovirus NOT DETECTED NOT DETECTED Final   Coronavirus 229E NOT DETECTED NOT DETECTED Final    Comment: (NOTE) The Coronavirus on the Respiratory Panel, DOES NOT test for the novel  Coronavirus (2019 nCoV)    Coronavirus HKU1 NOT DETECTED NOT DETECTED Final   Coronavirus NL63 NOT DETECTED NOT DETECTED Final   Coronavirus OC43 NOT DETECTED NOT DETECTED Final   Metapneumovirus NOT DETECTED NOT DETECTED Final  Rhinovirus / Enterovirus NOT DETECTED NOT DETECTED Final   Influenza A NOT DETECTED NOT DETECTED Final   Influenza B NOT DETECTED NOT DETECTED Final   Parainfluenza Virus 1 NOT DETECTED NOT DETECTED Final   Parainfluenza Virus 2 NOT DETECTED NOT DETECTED Final   Parainfluenza Virus 3 NOT DETECTED NOT DETECTED Final   Parainfluenza Virus 4 NOT DETECTED NOT DETECTED Final   Respiratory Syncytial Virus NOT DETECTED NOT DETECTED Final   Bordetella pertussis NOT DETECTED NOT DETECTED Final   Bordetella Parapertussis NOT DETECTED NOT DETECTED Final   Chlamydophila pneumoniae NOT DETECTED NOT DETECTED Final   Mycoplasma pneumoniae NOT DETECTED NOT DETECTED Final    Comment: Performed at [facility], [address] [place], [place] [address]  MRSA Next Gen by PCR, Nasal     Status: None   Collection Time: 11/10/22  1:25 PM   Specimen: Nasal Mucosa; Nasal Swab  Result Value Ref Range Status   MRSA by PCR Next Gen NOT DETECTED NOT DETECTED Final    Comment: (NOTE) The GeneXpert MRSA Assay (FDA approved for NASAL specimens only), is one component of a comprehensive MRSA colonization surveillance program. It  is not intended to diagnose MRSA infection nor to guide or monitor treatment for MRSA infections. Test performance is not FDA approved in patients less than 25 years West. Performed at [facility], [address], [place], [place] [address]     Labs: CBC: Recent Labs  Lab 11/09/22 2327 11/10/22 1015 11/11/22 0306  WBC 19.5* 22.0* 17.4*  NEUTROABS 15.6* 17.4*  --   HGB 15.6 14.0 13.2  HCT 46.5 42.1 40.2  MCV 87.2 89.4 89.9  PLT 416* 389 394   Basic Metabolic Panel: Recent Labs  Lab 11/09/22 2327 11/10/22 1015 11/11/22 0306  NA 133* 132* 134*  K 3.7 3.6 3.6  CL 98 100 101  CO2 GLUCOSE 117* 98 100*  BUN CREATININE 1.03 0.74 0.82  CALCIUM 8.9 8.5* 8.3*   Liver Function Tests: Recent Labs  Lab 11/09/22 2327 11/10/22 1015 11/11/22 0306  AST 97* 65* 39  ALT 117* 95* 68*  ALKPHOS 81 71 61  BILITOT 1.1 1.0 0.8  PROT 8.2* 7.5 6.2*  ALBUMIN 3.4* 3.3* 2.4*   CBG: No results for input(s): "GLUCAP" in the last 168 hours.  Discharge time spent: less than 30 minutes.  Signed: Pennie Banter, DO [facility] 11/12/2022

## 2022-11-12 NOTE — Progress Notes (Signed)
Patient completed O2 Sat test. O2 Sat stayed from 94-96. IV's removed. Discharge instructions and questions answered with wife and patient. Flu Vaccine administered. Patient refused pnuemonia vaccine at this time said he will follow up with his primary provider.

## 2022-11-13 LAB — CULTURE, BLOOD (ROUTINE X 2): Special Requests: ADEQUATE

## 2022-11-15 LAB — CULTURE, BLOOD (ROUTINE X 2): Culture: NO GROWTH

## 2022-11-16 LAB — LEGIONELLA PNEUMOPHILA SEROGP 1 UR AG: L. pneumophila Serogp 1 Ur Ag: NEGATIVE

## 2023-02-06 ENCOUNTER — Other Ambulatory Visit: Payer: Self-pay | Admitting: Nurse Practitioner

## 2023-02-06 DIAGNOSIS — R972 Elevated prostate specific antigen [PSA]: Secondary | ICD-10-CM

## 2023-03-06 ENCOUNTER — Other Ambulatory Visit: Payer: Self-pay | Admitting: Urology

## 2023-03-06 ENCOUNTER — Other Ambulatory Visit (HOSPITAL_COMMUNITY): Payer: Self-pay | Admitting: Urology

## 2023-03-06 DIAGNOSIS — N201 Calculus of ureter: Secondary | ICD-10-CM

## 2023-03-07 ENCOUNTER — Ambulatory Visit
Admission: RE | Admit: 2023-03-07 | Discharge: 2023-03-07 | Disposition: A | Discharge status: Home / Self Care | Payer: Self-pay | Source: Ambulatory Visit | Attending: Nurse Practitioner | Admitting: Nurse Practitioner

## 2023-03-07 DIAGNOSIS — R972 Elevated prostate specific antigen [PSA]: Secondary | ICD-10-CM

## 2023-03-07 MED ORDER — GADOPICLENOL 0.5 MMOL/ML IV SOLN
10.0000 mL | Freq: Once | INTRAVENOUS | Status: AC | PRN
  Administered 2023-03-07: 10 mL via INTRAVENOUS

## 2023-03-08 ENCOUNTER — Ambulatory Visit (HOSPITAL_COMMUNITY): Payer: Self-pay

## 2023-03-17 NOTE — Progress Notes (Addendum)
Anesthesia Review:  PCP: Deboraha Sprang at [facility]  Cardiologist : none  Chest x-ray : 11/10/22  EKG : 11/09/22  Echo : Stress test: Cardiac Cath :  Activity level: can do a flight of stairs without difficutly  Sleep Study/ CPAP : none  Fasting Blood Sugar :      / Checks Blood Sugar -- times a day:   Blood Thinner/ Instructions /Last Dose: ASA / Instructions/ Last Dose :    Leiden Factor V - on no anticoagulants    PT had pneumonia 11/09/2022 admitted to hospital.  Discharged on 11/12/22.  AT time of pneumonia pt reports blood pressure elevated .  Placed on Losartan daily.  PT reports he monitored blood pressure readings at home and blood pressure was normal.  Has not had blood pressure med in approx 2 months per pt.  AT preop appt on 03/20/23 blood pressure was 167/114 in left arm and in right arm was 182/121.  PT denies any chest pain, shortness of breath, dizzines, blurred vision or headache.  PT given copy of blood pressure readings and instructed to notify PCP today of blood pressure readings and to inform them he has not had b/p meds in approx 2 months.  PCP did not know ? He was taking blood pressure med ( Losartan).  PT instructed to follow PCP instructions and told pt MD would probably want to see in office.  .  PT voiced understanding.  Leticia Clas aware.

## 2023-03-17 NOTE — Patient Instructions (Signed)
SURGICAL WAITING ROOM VISITATION  Patients having surgery or a procedure may have no more than 2 support people in the waiting area - these visitors may rotate.    Children under the age of 67 must have an adult with them who is not the patient.  Due to an increase in RSV and influenza rates and associated hospitalizations, children ages 71 and under may not visit patients in [facility] hospitals.  If the patient needs to stay at the hospital during part of their recovery, the visitor guidelines for inpatient rooms apply. Pre-op nurse will coordinate an appropriate time for 1 support person to accompany patient in pre-op.  This support person may not rotate.    Please refer to the [facility] website for the visitor guidelines for Inpatients (after your surgery is over and you are in a regular room).       Your procedure is scheduled on:  04/07/2023    Report to [facility] Main Entrance    Report to admitting at  0730 AM   Call this number if you have problems the morning of surgery [phone]   Do not eat food  or driink liquids :After Midnight.               If you have questions, please contact your surgeon's office.     Oral Hygiene is also important to reduce your risk of infection.                                    Remember - BRUSH YOUR TEETH THE MORNING OF SURGERY WITH YOUR REGULAR TOOTHPASTE  DENTURES WILL BE REMOVED PRIOR TO SURGERY PLEASE DO NOT APPLY "Poly grip" OR ADHESIVES!!!   Do NOT smoke after Midnight   Take these medicines the morning of surgery with A SIP OF WATER:  allopurinol   DO NOT TAKE ANY ORAL DIABETIC MEDICATIONS DAY OF YOUR SURGERY  Bring CPAP mask and tubing day of surgery.                              You may not have any metal on your body including hair pins, jewelry, and body piercing             Do not wear make-up, lotions, powders, perfumes/cologne, or deodorant  Do not wear nail polish including gel and S&S,  artificial/acrylic nails, or any other type of covering on natural nails including finger and toenails. If you have artificial nails, gel coating, etc. that needs to be removed by a nail salon please have this removed prior to surgery or surgery may need to be canceled/ delayed if the surgeon/ anesthesia feels like they are unable to be safely monitored.   Do not shave  48 hours prior to surgery.               Men may shave face and neck.   Do not bring valuables to the hospital.  [facility] IS NOT             RESPONSIBLE   FOR VALUABLES.   Contacts, glasses, dentures or bridgework may not be worn into surgery.   Bring small overnight bag day of surgery.   DO NOT BRING YOUR HOME MEDICATIONS TO THE HOSPITAL. PHARMACY WILL DISPENSE MEDICATIONS LISTED ON YOUR MEDICATION LIST TO YOU DURING YOUR ADMISSION IN THE HOSPITAL!  Patients discharged on the day of surgery will not be allowed to drive home.  Someone NEEDS to stay with you for the first 24 hours after anesthesia.   Special Instructions: Bring a copy of your healthcare power of attorney and living will documents the day of surgery if you haven't scanned them before.              Please read over the following fact sheets you were given: IF YOU HAVE QUESTIONS ABOUT YOUR PRE-OP INSTRUCTIONS PLEASE CALL [phone]   If you received a COVID test during your pre-op visit  it is requested that you wear a mask when out in public, stay away from anyone that may not be feeling well and notify your surgeon if you develop symptoms. If you test positive for Covid or have been in contact with anyone that has tested positive in the last 10 days please notify you surgeon.    [place] - Preparing for Surgery Before surgery, you can play an important role.  Because skin is not sterile, your skin needs to be as free of germs as possible.  You can reduce the number of germs on your skin by washing with CHG (chlorahexidine gluconate) soap before  surgery.  CHG is an antiseptic cleaner which kills germs and bonds with the skin to continue killing germs even after washing. Please DO NOT use if you have an allergy to CHG or antibacterial soaps.  If your skin becomes reddened/irritated stop using the CHG and inform your nurse when you arrive at Short Stay. Do not shave (including legs and underarms) for at least 48 hours prior to the first CHG shower.  You may shave your face/neck. Please follow these instructions carefully:  1.  Shower with CHG Soap the night before surgery and the  morning of Surgery.  2.  If you choose to wash your hair, wash your hair first as usual with your  normal  shampoo.  3.  After you shampoo, rinse your hair and body thoroughly to remove the  shampoo.                           4.  Use CHG as you would any other liquid soap.  You can apply chg directly  to the skin and wash                       Gently with a scrungie or clean washcloth.  5.  Apply the CHG Soap to your body ONLY FROM THE NECK DOWN.   Do not use on face/ open                           Wound or open sores. Avoid contact with eyes, ears mouth and genitals (private parts).                       Wash face,  Genitals (private parts) with your normal soap.             6.  Wash thoroughly, paying special attention to the area where your surgery  will be performed.  7.  Thoroughly rinse your body with warm water from the neck down.  8.  DO NOT shower/wash with your normal soap after using and rinsing off  the CHG Soap.  9.  Pat yourself dry with a clean towel.            10.  Wear clean pajamas.            11.  Place clean sheets on your bed the night of your first shower and do not  sleep with pets. Day of Surgery : Do not apply any lotions/deodorants the morning of surgery.  Please wear clean clothes to the hospital/surgery center.  FAILURE TO FOLLOW THESE INSTRUCTIONS MAY RESULT IN THE CANCELLATION OF YOUR SURGERY PATIENT  SIGNATURE_________________________________  NURSE SIGNATURE__________________________________  ________________________________________________________________________

## 2023-03-20 ENCOUNTER — Other Ambulatory Visit: Payer: Self-pay

## 2023-03-20 ENCOUNTER — Encounter (HOSPITAL_COMMUNITY): Payer: Self-pay

## 2023-03-20 ENCOUNTER — Encounter (HOSPITAL_COMMUNITY)
Admission: RE | Admit: 2023-03-20 | Discharge: 2023-03-20 | Disposition: A | Discharge status: Home / Self Care | Payer: No Typology Code available for payment source | Source: Ambulatory Visit | Attending: Urology | Admitting: Urology

## 2023-03-20 VITALS — BP 167/114 | HR 71 | Temp 98.2°F | Resp 16 | Ht 72.0 in | Wt 228.0 lb

## 2023-03-20 DIAGNOSIS — Z01818 Encounter for other preprocedural examination: Secondary | ICD-10-CM

## 2023-03-20 DIAGNOSIS — Z01812 Encounter for preprocedural laboratory examination: Secondary | ICD-10-CM | POA: Insufficient documentation

## 2023-03-20 HISTORY — DX: Cardiac murmur, unspecified: R01.1

## 2023-03-20 HISTORY — DX: Gastro-esophageal reflux disease without esophagitis: K21.9

## 2023-03-20 HISTORY — DX: Pneumonia, unspecified organism: J18.9

## 2023-03-20 LAB — CBC
HCT: 49.4 % (ref 39.0–52.0)
Hemoglobin: 16.2 g/dL (ref 13.0–17.0)
MCH: 30.1 pg (ref 26.0–34.0)
MCHC: 32.8 g/dL (ref 30.0–36.0)
MCV: 91.8 fL (ref 80.0–100.0)
Platelets: 305 10*3/uL (ref 150–400)
RBC: 5.38 MIL/uL (ref 4.22–5.81)
RDW: 13.2 % (ref 11.5–15.5)
WBC: 6.5 10*3/uL (ref 4.0–10.5)
nRBC: 0 % (ref 0.0–0.2)

## 2023-03-20 LAB — BASIC METABOLIC PANEL
Anion gap: 11 (ref 5–15)
BUN: 19 mg/dL (ref 8–23)
CO2: 19 mmol/L — ABNORMAL LOW (ref 22–32)
Calcium: 9.6 mg/dL (ref 8.9–10.3)
Chloride: 108 mmol/L (ref 98–111)
Creatinine, Ser: 0.97 mg/dL (ref 0.61–1.24)
GFR, Estimated: >60 mL/min (ref >60)
Glucose, Bld: 97 mg/dL (ref 70–99)
Potassium: 4.5 mmol/L (ref 3.5–5.1)
Sodium: 138 mmol/L (ref 135–145)

## 2023-04-05 NOTE — Progress Notes (Signed)
Anesthesia Chart Review   Case: [id number] Date/Time: 04/07/23 1215   Procedure: LEFT NEPHROLITHOTOMY PERCUTANEOUS (Left) - 120 MINS   Anesthesia type: General   Pre-op diagnosis: LEFT RENAL AND URETERAL STONE   Location: [facility] ROOM 03 / [facility]   Surgeons: Crista Elliot, MD       DISCUSSION: 62 year old never smoker with history of GERD, factor V Leiden, left renal ureteral stone scheduled for above procedure 04/07/2023 with Dr. Modena Slater.   Patient with elevated blood pressure readings at PAT visit.  167/114, 182/121.  Patient reports he was started on losartan in December but had not been taking it.  Patient on 04/05/2023.  Patient restarted losartan after PAT visit.  He reports checking his BP which was 133/89.  Patient asymptomatic.  Advised to continue to monitor and follow-up with PCP if BP remains high.  Advised to hold losartan day of surgery.  Evaluate DOS.  VS: There were no vitals taken for this visit.  PROVIDERS: [facility]: Labs reviewed: Acceptable for surgery. (all labs ordered are listed, but only abnormal results are displayed)  Labs Reviewed - No data to display   IMAGES:   EKG:   CV:  Past Medical History:  Diagnosis Date   Acute respiratory failure with hypoxia (HCC) 11/10/2022   Brain aneurysm    Factor 5 Leiden mutation, heterozygous (HCC)    GERD (gastroesophageal reflux disease)    Gout    Heart murmur    as a child   History of kidney stones    Pneumonia    10/2022    Past Surgical History:  Procedure Laterality Date   CEREBRAL ANEURYSM REPAIR     coiling- 2006 approx   kidney stone procedures       MEDICATIONS: No current facility-administered medications for this encounter.    allopurinol (ZYLOPRIM) 300 MG tablet   HYDROcodone-acetaminophen (NORCO/VICODIN) 5-325 MG tablet   ketorolac (TORADOL) 10 MG tablet   loratadine (CLARITIN) 10 MG tablet   zolpidem (AMBIEN) 10 MG tablet   losartan  (COZAAR) 25 MG tablet    [facility], PA-C [facility] Pre-Surgical Testing [phone]

## 2023-04-05 NOTE — Anesthesia Preprocedure Evaluation (Addendum)
Anesthesia Evaluation  Patient identified by MRN, date of birth, ID band Patient awake    Reviewed: Allergy & Precautions, NPO status , Patient's Chart, lab work & pertinent test results  Airway Mallampati: III  TM Distance: >3 FB Neck ROM: Full  Mouth opening: Limited Mouth Opening  Dental  (+) Dental Advisory Given   Pulmonary asthma    breath sounds clear to auscultation       Cardiovascular hypertension, Pt. on medications  Rhythm:Regular Rate:Normal     Neuro/Psych negative neurological ROS     GI/Hepatic Neg liver ROS,GERD  ,,  Endo/Other  negative endocrine ROS    Renal/GU negative Renal ROS     Musculoskeletal   Abdominal   Peds  Hematology Factor V Leiden   Anesthesia Other Findings   Reproductive/Obstetrics                              Lab Results  Component Value Date   WBC 6.8 04/07/2023   HGB 16.1 04/07/2023   HCT 49.9 04/07/2023   MCV 91.1 04/07/2023   PLT 288 04/07/2023   Lab Results  Component Value Date   CREATININE 1.02 04/07/2023   BUN 21 04/07/2023   NA 137 04/07/2023   K 4.1 04/07/2023   CL 106 04/07/2023   CO2 21 (L) 04/07/2023    Anesthesia Physical Anesthesia Plan  ASA: 2  Anesthesia Plan: General   Post-op Pain Management: Tylenol PO (pre-op)* and Ketamine IV*   Induction: Intravenous  PONV Risk Score and Plan: 2 and Dexamethasone, Ondansetron and Treatment may vary due to age or medical condition  Airway Management Planned: Oral ETT and Video Laryngoscope Planned  Additional Equipment: None  Intra-op Plan:   Post-operative Plan: Extubation in OR  Informed Consent: I have reviewed the patients History and Physical, chart, labs and discussed the procedure including the risks, benefits and alternatives for the proposed anesthesia with the patient or authorized representative who has indicated his/her understanding and acceptance.      Dental advisory given  Plan Discussed with: CRNA  Anesthesia Plan Comments:         Anesthesia Quick Evaluation

## 2023-04-06 ENCOUNTER — Other Ambulatory Visit: Payer: Self-pay | Admitting: Radiology

## 2023-04-06 DIAGNOSIS — N201 Calculus of ureter: Secondary | ICD-10-CM

## 2023-04-06 NOTE — Consult Note (Signed)
Chief Complaint: Patient was seen in consultation today for left percutaneous nephrostomy/nephroureteral catheter placement  Referring Physician(s): Bell,E  Supervising Physician: Oley Balm  Patient Status: [facility] - Out-pt  TBA  History of Present Illness: Samuel West is a 62 y.o. 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  CT A/P  in April of this year revealed several renal calculi bilaterally. Largest are seen in the left renal collecting system measuring up to 1.3 cm. No evidence of ureteral calculi or hydronephrosis.  He presents today for left percutaneous nephrostomy/nephroureteral catheter placement prior to nephrolithotomy.  Past Medical History:  Diagnosis Date   Acute respiratory failure with hypoxia (HCC) 11/10/2022   Brain aneurysm    Factor 5 Leiden mutation, heterozygous (HCC)    GERD (gastroesophageal reflux disease)    Gout    Heart murmur    as a child   History of kidney stones    Pneumonia    10/2022    Past Surgical History:  Procedure Laterality Date   CEREBRAL ANEURYSM REPAIR     coiling- 2006 approx   kidney stone procedures       Allergies: Morphine and codeine  Medications: Prior to Admission medications   Medication Sig Start Date End Date Taking? Authorizing Provider  allopurinol (ZYLOPRIM) 300 MG tablet Take 300 mg by mouth in the morning. 07/03/18  Yes [provider]  HYDROcodone-acetaminophen (NORCO/VICODIN) 5-325 MG tablet Take 1 tablet by mouth every 6 (six) hours as needed (pain.).   Yes [provider]  ketorolac (TORADOL) 10 MG tablet Take 10 mg by mouth every 8 (eight) hours as needed (pain.).   Yes [provider]  loratadine (CLARITIN) 10 MG tablet Take 10 mg by mouth daily as needed for allergies.   Yes [provider]   zolpidem (AMBIEN) 10 MG tablet Take 10 mg by mouth at bedtime. 11/09/21  Yes [provider]  losartan (COZAAR) 25 MG tablet Take 1 tablet (25 mg total) by mouth daily. Patient not taking: Reported on 03/15/2023 11/13/22   Pennie Banter, DO     Family History  Problem Relation Age of Onset   Throat cancer Father     Social History   Socioeconomic History   Marital status: Married    Spouse name: Not on file   Number of children: Not on file   Years of education: Not on file   Highest education level: Not on file  Occupational History   Not on file  Tobacco Use   Smoking status: Never   Smokeless tobacco: Never  Vaping Use   Vaping Use: Never used  Substance and Sexual Activity   Alcohol use: Never   Drug use: Never   Sexual activity: Not on file  Other Topics Concern   Not on file  Social History Narrative   Not on file   Social Determinants of Health   Financial Resource Strain: Not on file  Food Insecurity: No Food Insecurity (11/10/2022)   Hunger Vital Sign    Worried About Running Out of Food in the Last Year: Never true    Ran Out of Food in the Last Year: Never true  Transportation Needs: No Transportation Needs (11/10/2022)   PRAPARE - Administrator, Civil Service (Medical): No    Lack of Transportation (Non-Medical): No  Physical Activity: Not on file  Stress:  Not on file  Social Connections: Not on file      Review of Systems denies fever, headache, chest pain, dyspnea, cough, abdominal pain, nausea, vomiting; does have left flank discomfort with some radiation to groin region, intermittent hematuria  Vital West: Vitals:   04/07/23 0845  BP: (!) 125/93    Advance care planning: Advance care planning/surrogate decision maker was discussed at the time of visit ;patient has documents at home and not available at this time.   Code Status: FULL CODE    Physical Exam: Awake, alert.  Chest clear to auscultation  bilaterally.  Heart with regular rate and rhythm.  Abdomen soft, positive bowel sounds, nontender. Mild left  CVAT ;no lower extremity edema.  Imaging: No results found.  Labs:  CBC: Recent Labs    11/09/22 2327 11/10/22 1015 11/11/22 0306 03/20/23 1034  WBC 19.5* 22.0* 17.4* 6.5  HGB 15.6 14.0 13.2 16.2  HCT 46.5 42.1 40.2 49.4  PLT 416* 389 394 305    COAGS: No results for input(s): "INR", "APTT" in the last 8760 hours.  BMP: Recent Labs    11/09/22 2327 11/10/22 1015 11/11/22 0306 03/20/23 1034  NA 133* 132* 134* 138  K 3.7 3.6 3.6 4.5  CL 98 100 101 108  CO2 24 22 23  19*  GLUCOSE 117* 98 100* 97  BUN 14 14 14 19   CALCIUM 8.9 8.5* 8.3* 9.6  CREATININE 1.03 0.74 0.82 0.97  GFRNONAA >60 >60 >60 >60    LIVER FUNCTION TESTS: Recent Labs    11/09/22 2327 11/10/22 1015 11/11/22 0306  BILITOT 1.1 1.0 0.8  AST 97* 65* 39  ALT 117* 95* 68*  ALKPHOS 81 71 61  PROT 8.2* 7.5 6.2*  ALBUMIN 3.4* 3.3* 2.4*    TUMOR MARKERS: No results for input(s): "AFPTM", "CEA", "CA199", "CHROMGRNA" in the last 8760 hours.  Assessment and Plan: 62 y.o. 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  CT A/P  in April of this year revealed several renal calculi bilaterally. Largest are seen in the left renal collecting system measuring up to 1.3 cm. No evidence of ureteral calculi or hydronephrosis.  He presents today for left percutaneous nephrostomy/nephroureteral catheter placement prior to nephrolithotomy.Risks and benefits of left PCN /nephroureteral catheter placement was discussed with the patient /spouse including, but not limited to, infection, bleeding, significant bleeding causing loss or decrease in renal function or damage to adjacent structures.   All of the patient's questions were answered, patient is  agreeable to proceed.  Consent signed and in chart.      Thank you for this interesting consult.  I greatly enjoyed meeting Samuel West and look forward to participating in their care.  A copy of this report was sent to the requesting provider on this date.  Electronically Signed: D. Jeananne Rama, PA-C 04/06/2023, 1:00 PM   I spent a total of  25 minutes  in face to face in clinical consultation, greater than 50% of which was counseling/coordinating care for left percutaneous nephrostomy/nephroureteral catheter placement

## 2023-04-07 ENCOUNTER — Encounter (HOSPITAL_COMMUNITY)
Admission: RE | Disposition: A | Discharge status: Home / Self Care | Payer: Self-pay | Source: Home / Self Care | Attending: Urology

## 2023-04-07 ENCOUNTER — Ambulatory Visit (HOSPITAL_COMMUNITY)
Admission: RE | Admit: 2023-04-07 | Discharge: 2023-04-07 | Disposition: A | Discharge status: Home / Self Care | Payer: No Typology Code available for payment source | Source: Ambulatory Visit

## 2023-04-07 ENCOUNTER — Ambulatory Visit (HOSPITAL_COMMUNITY)
Admission: RE | Admit: 2023-04-07 | Discharge: 2023-04-07 | Disposition: A | Discharge status: Home / Self Care | Payer: No Typology Code available for payment source | Source: Ambulatory Visit | Attending: Urology | Admitting: Urology

## 2023-04-07 ENCOUNTER — Ambulatory Visit (HOSPITAL_COMMUNITY): Payer: No Typology Code available for payment source | Admitting: Physician Assistant

## 2023-04-07 ENCOUNTER — Other Ambulatory Visit: Payer: Self-pay

## 2023-04-07 ENCOUNTER — Ambulatory Visit (HOSPITAL_BASED_OUTPATIENT_CLINIC_OR_DEPARTMENT_OTHER): Payer: No Typology Code available for payment source | Admitting: Physician Assistant

## 2023-04-07 ENCOUNTER — Ambulatory Visit (HOSPITAL_COMMUNITY): Payer: No Typology Code available for payment source

## 2023-04-07 ENCOUNTER — Encounter (HOSPITAL_COMMUNITY): Payer: Self-pay | Admitting: Urology

## 2023-04-07 ENCOUNTER — Observation Stay (HOSPITAL_COMMUNITY)
Admission: RE | Admit: 2023-04-07 | Discharge: 2023-04-08 | Disposition: A | Discharge status: Home / Self Care | Payer: No Typology Code available for payment source | Attending: Urology | Admitting: Urology

## 2023-04-07 DIAGNOSIS — N200 Calculus of kidney: Secondary | ICD-10-CM | POA: Diagnosis not present

## 2023-04-07 DIAGNOSIS — N201 Calculus of ureter: Secondary | ICD-10-CM

## 2023-04-07 DIAGNOSIS — Z8673 Personal history of transient ischemic attack (TIA), and cerebral infarction without residual deficits: Secondary | ICD-10-CM | POA: Insufficient documentation

## 2023-04-07 DIAGNOSIS — D6851 Activated protein C resistance: Secondary | ICD-10-CM

## 2023-04-07 DIAGNOSIS — Z01818 Encounter for other preprocedural examination: Secondary | ICD-10-CM

## 2023-04-07 DIAGNOSIS — J45909 Unspecified asthma, uncomplicated: Secondary | ICD-10-CM | POA: Diagnosis not present

## 2023-04-07 DIAGNOSIS — I10 Essential (primary) hypertension: Secondary | ICD-10-CM | POA: Diagnosis not present

## 2023-04-07 HISTORY — PX: NEPHROLITHOTOMY: SHX5134

## 2023-04-07 HISTORY — PX: IR URETERAL STENT LEFT NEW ACCESS W/O SEP NEPHROSTOMY CATH: IMG6075

## 2023-04-07 LAB — BASIC METABOLIC PANEL
Anion gap: 10 (ref 5–15)
Anion gap: 12 (ref 5–15)
BUN: 21 mg/dL (ref 8–23)
BUN: 18 mg/dL (ref 8–23)
CO2: 21 mmol/L — ABNORMAL LOW (ref 22–32)
CO2: 23 mmol/L (ref 22–32)
Calcium: 9.7 mg/dL (ref 8.9–10.3)
Calcium: 9.1 mg/dL (ref 8.9–10.3)
Chloride: 106 mmol/L (ref 98–111)
Chloride: 102 mmol/L (ref 98–111)
Creatinine, Ser: 1.02 mg/dL (ref 0.61–1.24)
Creatinine, Ser: 1.25 mg/dL — ABNORMAL HIGH (ref 0.61–1.24)
GFR, Estimated: >60 mL/min (ref >60)
GFR, Estimated: >60 mL/min (ref >60)
Glucose, Bld: 105 mg/dL — ABNORMAL HIGH (ref 70–99)
Glucose, Bld: 129 mg/dL — ABNORMAL HIGH (ref 70–99)
Potassium: 4.1 mmol/L (ref 3.5–5.1)
Potassium: 4.5 mmol/L (ref 3.5–5.1)
Sodium: 137 mmol/L (ref 135–145)
Sodium: 137 mmol/L (ref 135–145)

## 2023-04-07 LAB — CBC WITH DIFFERENTIAL/PLATELET
Abs Immature Granulocytes: 0.02 10*3/uL (ref 0.00–0.07)
Basophils Absolute: 0 10*3/uL (ref 0.0–0.1)
Basophils Relative: 0 %
Eosinophils Absolute: 0.1 10*3/uL (ref 0.0–0.5)
Eosinophils Relative: 2 %
HCT: 49.9 % (ref 39.0–52.0)
Hemoglobin: 16.1 g/dL (ref 13.0–17.0)
Immature Granulocytes: 0 %
Lymphocytes Relative: 29 %
Lymphs Abs: 2 10*3/uL (ref 0.7–4.0)
MCH: 29.4 pg (ref 26.0–34.0)
MCHC: 32.3 g/dL (ref 30.0–36.0)
MCV: 91.1 fL (ref 80.0–100.0)
Monocytes Absolute: 0.6 10*3/uL (ref 0.1–1.0)
Monocytes Relative: 9 %
Neutro Abs: 4 10*3/uL (ref 1.7–7.7)
Neutrophils Relative %: 60 %
Platelets: 288 10*3/uL (ref 150–400)
RBC: 5.48 MIL/uL (ref 4.22–5.81)
RDW: 13.7 % (ref 11.5–15.5)
WBC: 6.8 10*3/uL (ref 4.0–10.5)
nRBC: 0 % (ref 0.0–0.2)

## 2023-04-07 LAB — HEMOGLOBIN AND HEMATOCRIT, BLOOD
HCT: 50.9 % (ref 39.0–52.0)
Hemoglobin: 16.3 g/dL (ref 13.0–17.0)

## 2023-04-07 LAB — ABO/RH: ABO/RH(D): O POS

## 2023-04-07 LAB — TYPE AND SCREEN
ABO/RH(D): O POS
Antibody Screen: NEGATIVE

## 2023-04-07 LAB — PROTIME-INR
INR: 1 (ref 0.8–1.2)
Prothrombin Time: 13.5 seconds (ref 11.4–15.2)

## 2023-04-07 SURGERY — NEPHROLITHOTOMY PERCUTANEOUS
Anesthesia: General | Laterality: Left

## 2023-04-07 MED ORDER — OXYBUTYNIN CHLORIDE 5 MG PO TABS: 5.0000 mg | ORAL_TABLET | Freq: Three times a day (TID) | ORAL | Status: DC | PRN

## 2023-04-07 MED ORDER — DIPHENHYDRAMINE HCL 50 MG/ML IJ SOLN: 12.5000 mg | Freq: Four times a day (QID) | INTRAMUSCULAR | Status: DC | PRN

## 2023-04-07 MED ORDER — SODIUM CHLORIDE 0.9 % IV SOLN: INTRAVENOUS | Status: DC

## 2023-04-07 MED ORDER — ACETAMINOPHEN 10 MG/ML IV SOLN
INTRAVENOUS | Status: AC
  Filled 2023-04-07: qty 100

## 2023-04-07 MED ORDER — ROCURONIUM BROMIDE 10 MG/ML (PF) SYRINGE
PREFILLED_SYRINGE | INTRAVENOUS | Status: AC
  Filled 2023-04-07: qty 10

## 2023-04-07 MED ORDER — FENTANYL CITRATE (PF) 100 MCG/2ML IJ SOLN: 50.0000 ug | Freq: Once | INTRAMUSCULAR | Status: DC

## 2023-04-07 MED ORDER — ROCURONIUM BROMIDE 10 MG/ML (PF) SYRINGE
PREFILLED_SYRINGE | INTRAVENOUS | Status: DC | PRN
  Administered 2023-04-07: 60 mg via INTRAVENOUS

## 2023-04-07 MED ORDER — LOSARTAN POTASSIUM 25 MG PO TABS
25.0000 mg | ORAL_TABLET | Freq: Every day | ORAL | Status: DC
  Administered 2023-04-08: 25 mg via ORAL
  Filled 2023-04-07: qty 1

## 2023-04-07 MED ORDER — FENTANYL CITRATE (PF) 100 MCG/2ML IJ SOLN
INTRAMUSCULAR | Status: AC
  Filled 2023-04-07: qty 4

## 2023-04-07 MED ORDER — HYDROMORPHONE HCL 1 MG/ML IJ SOLN
0.5000 mg | INTRAMUSCULAR | Status: DC | PRN
  Administered 2023-04-07 (×4): 1 mg via INTRAVENOUS
  Administered 2023-04-08: 0.5 mg via INTRAVENOUS
  Administered 2023-04-08 (×2): 1 mg via INTRAVENOUS
  Filled 2023-04-07 (×7): qty 1

## 2023-04-07 MED ORDER — FENTANYL CITRATE PF 50 MCG/ML IJ SOSY
PREFILLED_SYRINGE | INTRAMUSCULAR | Status: AC
  Administered 2023-04-07: 50 ug
  Filled 2023-04-07: qty 1

## 2023-04-07 MED ORDER — PROPOFOL 10 MG/ML IV BOLUS
INTRAVENOUS | Status: AC
  Filled 2023-04-07: qty 20

## 2023-04-07 MED ORDER — FENTANYL CITRATE (PF) 100 MCG/2ML IJ SOLN
INTRAMUSCULAR | Status: AC | PRN
  Administered 2023-04-07 (×2): 50 ug via INTRAVENOUS

## 2023-04-07 MED ORDER — ONDANSETRON HCL 4 MG/2ML IJ SOLN: 4.0000 mg | Freq: Once | INTRAMUSCULAR | Status: DC

## 2023-04-07 MED ORDER — CHLORHEXIDINE GLUCONATE 0.12 % MT SOLN
15.0000 mL | Freq: Once | OROMUCOSAL | Status: AC
  Administered 2023-04-07: 15 mL via OROMUCOSAL

## 2023-04-07 MED ORDER — DEXMEDETOMIDINE HCL IN NACL 80 MCG/20ML IV SOLN
INTRAVENOUS | Status: DC | PRN
  Administered 2023-04-07 (×2): 8 ug via INTRAVENOUS

## 2023-04-07 MED ORDER — DEXAMETHASONE SODIUM PHOSPHATE 10 MG/ML IJ SOLN
INTRAMUSCULAR | Status: AC
  Filled 2023-04-07: qty 1

## 2023-04-07 MED ORDER — LACTATED RINGERS IV SOLN: INTRAVENOUS | Status: DC

## 2023-04-07 MED ORDER — LIDOCAINE 2% (20 MG/ML) 5 ML SYRINGE
INTRAMUSCULAR | Status: DC | PRN
  Administered 2023-04-07: 60 mg via INTRAVENOUS

## 2023-04-07 MED ORDER — DEXAMETHASONE SODIUM PHOSPHATE 10 MG/ML IJ SOLN
INTRAMUSCULAR | Status: DC | PRN
  Administered 2023-04-07: 10 mg via INTRAVENOUS

## 2023-04-07 MED ORDER — DOCUSATE SODIUM 100 MG PO CAPS
100.0000 mg | ORAL_CAPSULE | Freq: Two times a day (BID) | ORAL | Status: DC
  Administered 2023-04-07: 100 mg via ORAL
  Filled 2023-04-07: qty 1

## 2023-04-07 MED ORDER — FENTANYL CITRATE PF 50 MCG/ML IJ SOSY
25.0000 ug | PREFILLED_SYRINGE | INTRAMUSCULAR | Status: DC | PRN
  Administered 2023-04-07 (×3): 50 ug via INTRAVENOUS

## 2023-04-07 MED ORDER — AMISULPRIDE (ANTIEMETIC) 5 MG/2ML IV SOLN: 10.0000 mg | Freq: Once | INTRAVENOUS | Status: DC | PRN

## 2023-04-07 MED ORDER — DIPHENHYDRAMINE HCL 50 MG/ML IJ SOLN
INTRAMUSCULAR | Status: AC | PRN
  Administered 2023-04-07: 25 mg via INTRAVENOUS

## 2023-04-07 MED ORDER — ONDANSETRON HCL 4 MG/2ML IJ SOLN
INTRAMUSCULAR | Status: AC
  Filled 2023-04-07: qty 2

## 2023-04-07 MED ORDER — TAMSULOSIN HCL 0.4 MG PO CAPS: 0.4000 mg | ORAL_CAPSULE | Freq: Every day | ORAL | 1 refills | Status: AC

## 2023-04-07 MED ORDER — ACETAMINOPHEN 10 MG/ML IV SOLN
INTRAVENOUS | Status: DC | PRN
  Administered 2023-04-07: 1000 mg via INTRAVENOUS

## 2023-04-07 MED ORDER — FENTANYL CITRATE PF 50 MCG/ML IJ SOSY
PREFILLED_SYRINGE | INTRAMUSCULAR | Status: AC
  Filled 2023-04-07: qty 3

## 2023-04-07 MED ORDER — IOHEXOL 300 MG/ML  SOLN
INTRAMUSCULAR | Status: DC | PRN
  Administered 2023-04-07: 25 mL

## 2023-04-07 MED ORDER — ACETAMINOPHEN 325 MG PO TABS: 650.0000 mg | ORAL_TABLET | ORAL | Status: DC | PRN

## 2023-04-07 MED ORDER — SODIUM CHLORIDE 0.9% FLUSH
5.0000 mL | Freq: Three times a day (TID) | INTRAVENOUS | Status: DC
  Administered 2023-04-07 – 2023-04-08 (×2): 5 mL

## 2023-04-07 MED ORDER — PROPOFOL 10 MG/ML IV BOLUS
INTRAVENOUS | Status: DC | PRN
  Administered 2023-04-07: 200 mg via INTRAVENOUS

## 2023-04-07 MED ORDER — NALOXONE HCL 0.4 MG/ML IJ SOLN
INTRAMUSCULAR | Status: AC
  Filled 2023-04-07: qty 1

## 2023-04-07 MED ORDER — IOHEXOL 300 MG/ML  SOLN
50.0000 mL | Freq: Once | INTRAMUSCULAR | Status: AC | PRN
  Administered 2023-04-07: 5 mL

## 2023-04-07 MED ORDER — FENTANYL CITRATE (PF) 250 MCG/5ML IJ SOLN
INTRAMUSCULAR | Status: DC | PRN
  Administered 2023-04-07: 100 ug via INTRAVENOUS
  Administered 2023-04-07: 50 ug via INTRAVENOUS

## 2023-04-07 MED ORDER — ONDANSETRON HCL 4 MG/2ML IJ SOLN
INTRAMUSCULAR | Status: AC
  Administered 2023-04-07: 4 mg
  Filled 2023-04-07: qty 2

## 2023-04-07 MED ORDER — CEFAZOLIN SODIUM-DEXTROSE 2-4 GM/100ML-% IV SOLN
2.0000 g | INTRAVENOUS | Status: AC
  Administered 2023-04-07: 2 g via INTRAVENOUS
  Filled 2023-04-07: qty 100

## 2023-04-07 MED ORDER — KETOROLAC TROMETHAMINE 30 MG/ML IJ SOLN
30.0000 mg | Freq: Three times a day (TID) | INTRAMUSCULAR | Status: DC | PRN
  Administered 2023-04-07 – 2023-04-08 (×2): 30 mg via INTRAVENOUS
  Filled 2023-04-07 (×2): qty 1

## 2023-04-07 MED ORDER — TRIPLE ANTIBIOTIC 3.5-400-5000 EX OINT: 1.0000 | TOPICAL_OINTMENT | Freq: Three times a day (TID) | CUTANEOUS | Status: DC | PRN

## 2023-04-07 MED ORDER — MIDAZOLAM HCL 2 MG/2ML IJ SOLN
INTRAMUSCULAR | Status: AC
  Filled 2023-04-07: qty 2

## 2023-04-07 MED ORDER — KETAMINE HCL 50 MG/5ML IJ SOSY
PREFILLED_SYRINGE | INTRAMUSCULAR | Status: AC
  Filled 2023-04-07: qty 5

## 2023-04-07 MED ORDER — ALLOPURINOL 300 MG PO TABS
300.0000 mg | ORAL_TABLET | Freq: Every morning | ORAL | Status: DC
  Administered 2023-04-07 – 2023-04-08 (×2): 300 mg via ORAL
  Filled 2023-04-07 (×2): qty 1

## 2023-04-07 MED ORDER — ORAL CARE MOUTH RINSE: 15.0000 mL | Freq: Once | OROMUCOSAL | Status: AC

## 2023-04-07 MED ORDER — HYDROCODONE-ACETAMINOPHEN 5-325 MG PO TABS: 1.0000 | ORAL_TABLET | Freq: Four times a day (QID) | ORAL | 0 refills | Status: DC | PRN

## 2023-04-07 MED ORDER — KETAMINE HCL-SODIUM CHLORIDE 100-0.9 MG/10ML-% IV SOSY
PREFILLED_SYRINGE | INTRAVENOUS | Status: DC | PRN
  Administered 2023-04-07: 30 mg via INTRAVENOUS

## 2023-04-07 MED ORDER — FENTANYL CITRATE PF 50 MCG/ML IJ SOSY: 50.0000 ug | PREFILLED_SYRINGE | Freq: Once | INTRAMUSCULAR | Status: DC

## 2023-04-07 MED ORDER — LIDOCAINE-EPINEPHRINE 1 %-1:100000 IJ SOLN
20.0000 mL | Freq: Once | INTRAMUSCULAR | Status: AC
  Administered 2023-04-07: 7 mL

## 2023-04-07 MED ORDER — SODIUM CHLORIDE 0.9 % IV SOLN
INTRAVENOUS | Status: AC
  Filled 2023-04-07: qty 20

## 2023-04-07 MED ORDER — LIDOCAINE HCL (PF) 2 % IJ SOLN
INTRAMUSCULAR | Status: AC
  Filled 2023-04-07: qty 5

## 2023-04-07 MED ORDER — DIPHENHYDRAMINE HCL 50 MG/ML IJ SOLN
INTRAMUSCULAR | Status: AC
  Filled 2023-04-07: qty 1

## 2023-04-07 MED ORDER — ONDANSETRON HCL 4 MG/2ML IJ SOLN
4.0000 mg | INTRAMUSCULAR | Status: DC | PRN
  Administered 2023-04-07: 4 mg via INTRAVENOUS
  Filled 2023-04-07: qty 2

## 2023-04-07 MED ORDER — FENTANYL CITRATE (PF) 250 MCG/5ML IJ SOLN
INTRAMUSCULAR | Status: AC
  Filled 2023-04-07: qty 5

## 2023-04-07 MED ORDER — OXYCODONE HCL 5 MG PO TABS
5.0000 mg | ORAL_TABLET | ORAL | Status: DC | PRN
  Administered 2023-04-07 – 2023-04-08 (×4): 5 mg via ORAL
  Filled 2023-04-07 (×4): qty 1

## 2023-04-07 MED ORDER — SENNA 8.6 MG PO TABS
1.0000 | ORAL_TABLET | Freq: Two times a day (BID) | ORAL | Status: DC
  Administered 2023-04-07 – 2023-04-08 (×2): 8.6 mg via ORAL
  Filled 2023-04-07 (×2): qty 1

## 2023-04-07 MED ORDER — HYDROCODONE-ACETAMINOPHEN 5-325 MG PO TABS
1.0000 | ORAL_TABLET | ORAL | Status: DC | PRN
  Administered 2023-04-07: 2 via ORAL
  Filled 2023-04-07 (×2): qty 2

## 2023-04-07 MED ORDER — ONDANSETRON HCL 4 MG/2ML IJ SOLN
INTRAMUSCULAR | Status: DC | PRN
  Administered 2023-04-07: 4 mg via INTRAVENOUS

## 2023-04-07 MED ORDER — FENTANYL CITRATE PF 50 MCG/ML IJ SOSY
50.0000 ug | PREFILLED_SYRINGE | Freq: Once | INTRAMUSCULAR | Status: AC
  Administered 2023-04-07: 50 ug via INTRAVENOUS
  Filled 2023-04-07: qty 1

## 2023-04-07 MED ORDER — FLUMAZENIL 0.5 MG/5ML IV SOLN
INTRAVENOUS | Status: AC
  Filled 2023-04-07: qty 5

## 2023-04-07 MED ORDER — SODIUM CHLORIDE 0.9 % IV SOLN
2.0000 g | Freq: Once | INTRAVENOUS | Status: AC
  Administered 2023-04-07: 2 g via INTRAVENOUS
  Filled 2023-04-07: qty 20

## 2023-04-07 MED ORDER — SODIUM CHLORIDE 0.9 % IR SOLN
Status: DC | PRN
  Administered 2023-04-07: 12000 mL

## 2023-04-07 MED ORDER — MIDAZOLAM HCL 2 MG/2ML IJ SOLN
INTRAMUSCULAR | Status: AC
  Filled 2023-04-07: qty 4

## 2023-04-07 MED ORDER — 0.9 % SODIUM CHLORIDE (POUR BTL) OPTIME
TOPICAL | Status: DC | PRN
  Administered 2023-04-07: 1000 mL

## 2023-04-07 MED ORDER — LIDOCAINE-EPINEPHRINE 1 %-1:100000 IJ SOLN
INTRAMUSCULAR | Status: AC
  Filled 2023-04-07: qty 1

## 2023-04-07 MED ORDER — MIDAZOLAM HCL 2 MG/2ML IJ SOLN
INTRAMUSCULAR | Status: AC | PRN
  Administered 2023-04-07: 2 mg via INTRAVENOUS
  Administered 2023-04-07 (×2): 1 mg via INTRAVENOUS

## 2023-04-07 MED ORDER — DIPHENHYDRAMINE HCL 12.5 MG/5ML PO ELIX: 12.5000 mg | ORAL_SOLUTION | Freq: Four times a day (QID) | ORAL | Status: DC | PRN

## 2023-04-07 MED ORDER — ZOLPIDEM TARTRATE 5 MG PO TABS
10.0000 mg | ORAL_TABLET | Freq: Every day | ORAL | Status: DC
  Administered 2023-04-07: 10 mg via ORAL
  Filled 2023-04-07: qty 2

## 2023-04-07 MED ORDER — SUGAMMADEX SODIUM 200 MG/2ML IV SOLN
INTRAVENOUS | Status: DC | PRN
  Administered 2023-04-07: 225 mg via INTRAVENOUS

## 2023-04-07 MED ORDER — TAMSULOSIN HCL 0.4 MG PO CAPS
0.4000 mg | ORAL_CAPSULE | Freq: Every day | ORAL | Status: DC
  Administered 2023-04-07 – 2023-04-08 (×2): 0.4 mg via ORAL
  Filled 2023-04-07 (×2): qty 1

## 2023-04-07 MED ORDER — PHENYLEPHRINE 80 MCG/ML (10ML) SYRINGE FOR IV PUSH (FOR BLOOD PRESSURE SUPPORT)
PREFILLED_SYRINGE | INTRAVENOUS | Status: AC
  Filled 2023-04-07: qty 10

## 2023-04-07 SURGICAL SUPPLY — 53 items
APL PRP STRL LF DISP 70% ISPRP (MISCELLANEOUS) ×1
APL SKNCLS STERI-STRIP NONHPOA (GAUZE/BANDAGES/DRESSINGS) ×1
BAG COUNTER SPONGE SURGICOUNT (BAG) IMPLANT
BAG DRN RND TRDRP ANRFLXCHMBR (UROLOGICAL SUPPLIES)
BAG SPNG CNTER NS LX DISP (BAG)
BAG URINE DRAIN 2000ML AR STRL (UROLOGICAL SUPPLIES) IMPLANT
BASKET ZERO TIP NITINOL 2.4FR (BASKET) IMPLANT
BENZOIN TINCTURE PRP APPL 2/3 (GAUZE/BANDAGES/DRESSINGS) ×1 IMPLANT
BLADE SURG 15 STRL LF DISP TIS (BLADE) ×1 IMPLANT
BLADE SURG 15 STRL SS (BLADE) ×1
BSKT STON RTRVL ZERO TP 2.4FR (BASKET) ×1
CATH FOLEY 2W COUNCIL 20FR 5CC (CATHETERS) IMPLANT
CATH ROBINSON RED A/P 20FR (CATHETERS) IMPLANT
CATH URETERAL DUAL LUMEN 10F (MISCELLANEOUS) ×1 IMPLANT
CATH X-FORCE N30 NEPHROSTOMY (TUBING) ×1 IMPLANT
CHLORAPREP W/TINT 26 (MISCELLANEOUS) ×1 IMPLANT
COVER BACK TABLE 60X90IN (DRAPES) ×1 IMPLANT
COVER SURGICAL LIGHT HANDLE (MISCELLANEOUS) IMPLANT
DRAPE C-ARM 42X120 X-RAY (DRAPES) ×1 IMPLANT
DRAPE LINGEMAN PERC (DRAPES) ×1 IMPLANT
DRAPE SURG IRRIG POUCH 19X23 (DRAPES) ×1 IMPLANT
DRSG TEGADERM 8X12 (GAUZE/BANDAGES/DRESSINGS) IMPLANT
GAUZE PAD ABD 8X10 STRL (GAUZE/BANDAGES/DRESSINGS) ×2 IMPLANT
GAUZE SPONGE 4X4 12PLY STRL (GAUZE/BANDAGES/DRESSINGS) IMPLANT
GLOVE BIO SURGEON STRL SZ7.5 (GLOVE) ×1 IMPLANT
GOWN STRL REUS W/ TWL XL LVL3 (GOWN DISPOSABLE) ×1 IMPLANT
GOWN STRL REUS W/TWL XL LVL3 (GOWN DISPOSABLE) ×1
GUIDEWIRE AMPLAZ .035X145 (WIRE) ×2 IMPLANT
GUIDEWIRE STR DUAL SENSOR (WIRE) IMPLANT
KIT BASIN OR (CUSTOM PROCEDURE TRAY) ×1 IMPLANT
KIT PROBE TRILOGY 3.9X350 (MISCELLANEOUS) IMPLANT
KIT TURNOVER KIT A (KITS) IMPLANT
LASER FIB FLEXIVA PULSE ID 365 (Laser) IMPLANT
LUBRICANT JELLY K Y 4OZ (MISCELLANEOUS) ×1 IMPLANT
MANIFOLD NEPTUNE II (INSTRUMENTS) ×1 IMPLANT
NS IRRIG 1000ML POUR BTL (IV SOLUTION) ×1 IMPLANT
PACK CYSTO (CUSTOM PROCEDURE TRAY) IMPLANT
SPONGE T-LAP 4X18 ~~LOC~~+RFID (SPONGE) ×1 IMPLANT
STENT ENDOURETEROTOMY 7-14 26C (STENTS) IMPLANT
STENT URET 6FRX28 CONTOUR (STENTS) IMPLANT
SUT CHROMIC 3 0 SH 27 (SUTURE) IMPLANT
SUT SILK 2 0 30  PSL (SUTURE)
SUT SILK 2 0 30 PSL (SUTURE) IMPLANT
SYR 10ML LL (SYRINGE) ×1 IMPLANT
SYR 20ML LL LF (SYRINGE) ×1 IMPLANT
TOWEL OR 17X26 10 PK STRL BLUE (TOWEL DISPOSABLE) ×1 IMPLANT
TRACTIP FLEXIVA PULS ID 200XHI (Laser) IMPLANT
TRACTIP FLEXIVA PULSE ID 200 (Laser)
TRAY FOLEY MTR SLVR 16FR STAT (SET/KITS/TRAYS/PACK) ×1 IMPLANT
TUBING CONNECTING 10 (TUBING) ×1 IMPLANT
TUBING STONE CATCHER TRILOGY (MISCELLANEOUS) IMPLANT
TUBING UROLOGY SET (TUBING) ×1 IMPLANT
WATER STERILE IRR 1000ML POUR (IV SOLUTION) ×1 IMPLANT

## 2023-04-07 NOTE — Anesthesia Procedure Notes (Addendum)
Procedure Name: Intubation Date/Time: 04/07/2023 1:01 PM  Performed by: Lovie Chol, CRNAPre-anesthesia Checklist: Patient identified, Emergency Drugs available, Suction available and Patient being monitored Patient Re-evaluated:Patient Re-evaluated prior to induction Oxygen Delivery Method: Circle System Utilized Preoxygenation: Pre-oxygenation with 100% oxygen Induction Type: IV induction Ventilation: Mask ventilation without difficulty Laryngoscope Size: Glidescope and 4 Grade View: Grade I Tube type: Oral Tube size: 7.5 mm Number of attempts: 1 Airway Equipment and Method: Rigid stylet and Video-laryngoscopy Placement Confirmation: ETT inserted through vocal cords under direct vision, positive ETCO2 and breath sounds checked- equal and bilateral Secured at: 23 cm Tube secured with: Tape Dental Injury: Teeth and Oropharynx as per pre-operative assessment

## 2023-04-07 NOTE — Procedures (Signed)
  Procedure:  Fluoro guided antegrade Left nephroureteral catheter 32f Preprocedure diagnosis: The encounter diagnosis was Left ureteral stone. Postprocedure diagnosis: same EBL:    minimal Complications:   none immediate  See full dictation in [employer].  Thora Lance MD Main # [phone] Pager  [phone] Mobile [phone]

## 2023-04-07 NOTE — Plan of Care (Signed)
  Problem: Education: Goal: Knowledge of General Education information will improve Description: Including pain rating scale, medication(s)/side effects and non-pharmacologic comfort measures Outcome: Completed/Met   Problem: Health Behavior/Discharge Planning: Goal: Ability to manage health-related needs will improve Outcome: Completed/Met   Problem: Clinical Measurements: Goal: Ability to maintain clinical measurements within normal limits will improve Outcome: Completed/Met Goal: Diagnostic test results will improve Outcome: Completed/Met Goal: Respiratory complications will improve Outcome: Completed/Met   Problem: Safety: Goal: Ability to remain free from injury will improve Outcome: Completed/Met

## 2023-04-07 NOTE — Op Note (Signed)
Operative Note  Preoperative diagnosis:  1. left Renal calculus  Postoperative diagnosis: 1.  Left renal calculus   Procedure(s): 1.  Left percutaneous nephrolithotomy  Surgeon: Modena Slater, MD  Assistants: None  Anesthesia: General  Complications: None immediate  EBL: 50 cc  Specimens: 1.  Renal calculus  Drains/Catheters: 1.  6 x 26 double-J ureteral stent  Intraoperative findings: Patient had multiple renal calculi.  All radiopaque calculi removed.  No stones remaining after conclusion of the case.  Indication: 62 year old male with multiple left renal calculi presents for the previously mentioned operation.  Description of procedure:  The patient was identified and consent was obtained.  The patient was taken to the operating room and placed in the supine position.  The patient was placed under general anesthesia.  Perioperative antibiotics were administered.  The patient was placed in prone position and all pressure points were padded.  Patient was prepped and draped in a standard sterile fashion and a timeout was performed.  A Super Stiff wire was advanced through the nephroureteral stent down to the bladder under fluoroscopic guidance and the nephroureteral stent was removed.  A dual-lumen ureteral catheter was advanced over the Super Stiff wire into the renal pelvis and an antegrade nephrostogram was performed.  This showed a well opacified kidney and a filling defect corresponding to the stone of interest.  I advanced the dual-lumen ureteral catheter into the proximal ureter under fluoroscopic guidance followed by placement of a second Super Stiff wire down to the bladder under fluoroscopic guidance.  The dual-lumen catheter was removed.  An incision was made alongside the wires.  The balloon dilator was then advanced over one of the wires and into the renal pelvis fluoroscopic guidance and the tract was dilated to a pressure of 18.  The sheath was advanced over the  balloon and into the renal pelvis.  The balloon was withdrawn keeping the sheath in place.  The nephroscope was advanced into the kidney and the stone of interest was encountered.  The stones were removed with graspers.  There was 1 more radiopaque stone in the upper pole.  I exchanged for a flexible cystoscope and was able to access the upper pole and basket extracted the remaining stone.  All stone was extracted.  All stone was removed and there was no evidence of any other stones within the kidney.   A 6 x 28 double-J ureteral stent was advanced over 1 of the wires under fluoroscopic guidance and the wire was withdrawn.  Fluoroscopy confirmed a good coil within the bladder as well as a good coil in the renal pelvis proximally.  I removed the other wire.  I again confirmed proper placement of the stent proximally and distally and then removed the nephroscope and sheath.  I closed the incision with running 3-0 chromic and Dermabond.  This concluded the operation.  The patient tolerated procedure well and was stable postoperatively.  Plan: Patient will remain under observation overnight. Stent to be removed in 1 week.

## 2023-04-07 NOTE — Discharge Instructions (Signed)
Discharge instructions following PCNL ° °Call your doctor for: °Fevers greater than 100.5 °Severe nausea or vomiting °Increasing pain not controlled by pain medication °Increasing redness or drainage from incisions °Decreased urine output or a catheter is no longer draining ° °The number for questions is [phone]. ° °Activity: °Gradually increase activity with short frequent walks, 3-4 times a day.  Avoid strenuous activities, like sports, lawn-mowing, or heavy lifting (more than 10-15 pounds).  Wear loose, comfortable clothing that pull or kink the tube or tubes.  Do not drive while taking pain medication, or until your doctor permitts it. ° °Bathing and dressing changes: °You should not shower for 48 hours after surgery.  Do not soak your back in a bathtub. ° °Drainage bag care: °You may be discharged with a drainage bag around the site of your surgery.  The drainage bag should be secured such that it never pulls or loosens to prevent it from leaking.  It is important to wash her hands before and after emptying the drainage bag to help prevent the spread of infection.  The drainage bag should be emptied as needed.  When the wound stops draining or it is manageable with a dry gauze dressing, you can remove the bag. ° °If your tube in the back was removed, you should expect to have some leakage of fluid from the back incision.  This should slowly decrease and stop over the next couple of days.  If you have severe pain or persistent leakage, please call the number above.  Otherwise, your dressing can be changed 1-2 times daily or more if needed. ° °Diet: °It is extremely important to drink plenty of fluids after surgery, especially water.  You may resume your regular diet, unless otherwise instructed. ° °Medications: °May take Tylenol (acetaminophen) or ibuprofen (Advil, Motrin) as directed over-the-counter. °Take any prescriptions as directed. ° °Follow-up appointments: °Follow-up appointment will be scheduled  with Dr. Paitlyn Mcclatchey °

## 2023-04-07 NOTE — Transfer of Care (Signed)
Immediate Anesthesia Transfer of Care Note  Patient: Samuel West  Procedure(s) Performed: LEFT NEPHROLITHOTOMY PERCUTANEOUS (Left)  Patient Location: PACU  Anesthesia Type:General  Level of Consciousness: awake, oriented, and patient cooperative  Airway & Oxygen Therapy: Patient Spontanous Breathing and Patient connected to face mask oxygen  Post-op Assessment: Report given to RN and Post -op Vital signs reviewed and stable  Post vital signs: Reviewed  Last Vitals:  Vitals Value Taken Time  BP 165/103 04/07/23 1450  Temp 36.6 C 04/07/23 1448  Pulse 74 04/07/23 1453  Resp 16 04/07/23 1453  SpO2 100 % 04/07/23 1453  Vitals shown include unvalidated device data.  Last Pain:  Vitals:   04/07/23 1205  TempSrc:   PainSc: 7       Patients Stated Pain Goal: 5 (04/07/23 0827)  Complications: No notable events documented.

## 2023-04-07 NOTE — H&P (Signed)
CC/HPI: 02/02/2023: Patient with past history of nephrolithiasis, ureteral stricture having undergone endopyelotomy and URS with Dr Alvester Morin in the past. He has not been seen here in clinic since 2022. Referred back to [place] by primary care due to elevated PSA. This process started a month or so ago when patient was in for routine checkup and wanted his testosterone levels checked due to having problems with erectile dysfunction. Patient endorses some what he describes as 8 expected fatigue but this is generally not limiting. He denies issues with libido, mood, cognition or memory. Primary care check testosterone levels in mid February and there were noted to be at 125. They were rechecked on March 7 and noted to be 171. The PSA at that time was 4.32 prompting the referral here. Patient tells me he cannot recall the last time he had had his PSA checked, he states it has been more than a few years.   01/03/23: Testosterone 125  01/26/23: Testosterone 171; PSA 4.32   Patient has a prior history of brain aneurysm and CVA. 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 No other further correlation documentation in regards to this except for the MRI reports I can visualize in PACS. I do not have any documentation in regards to FSH/LH as well as prolactin levels.   Patient also endorses having intermittent pain and discomfort in the left and right lower back with some radiation into the flank which has been very mild and not limiting but intermittently occurring for several months or more. He also describes what sounds like gross hematuria with questionable stone material passage but he is not unsure occurring from time to time. Asymptomatic at this visit in regards to this. He does have microscopic hematuria on today's UA.   Patient also endorses some  underlying BPH with mild obstructive voiding symptoms. He typically does not have bothersome nocturia but can have urgency to the point of mildly prior to reaching the toilet. He denies any trouble starting his stream or achieving sensation of incomplete bladder emptying. He denies dysuria.   03/02/2023: The patient has completed previously ordered CT hematuria protocol study. This did not show any obvious GU abnormality consistent with malignancy. He did have bilateral nonobstructing calculi with the largest collection noted within the left renal collecting system measuring up to around a centimeter. There is no obstructive signs or ureteral calculi noted. Today seen acutely due to a 4 to 5-day history of worsening left lower back pain radiating into the left side of his groin. He has been using some ibuprofen which mildly controls his symptoms. Also associated with some intermittent gross hematuria. He denies interval stone material passage. Denies fevers or chills, nausea/vomiting. He still has a pending prostate MRI. Also previously scheduled to see Dr. Alvester Morin on the 29th for evaluation and cystoscopy.   03/20/2023  Patient presents today for cystoscopy for his gross hematuria. Also underwent an MRI of the prostate which revealed a 65 g prostate with no evidence of concerning lesion. He is scheduled for surgery on 5/17 for left PCNL. He recently passed a right-sided calculus as well. Has intermittent left-sided abdominal pain/back pain. Also complains about some erectile dysfunction. Also with hypogonadal symptoms including fatigue, erectile dysfunction, decreased libido. Interested in testosterone replacement.     ALLERGIES: Morphine    MEDICATIONS:  Allopurinol  Ketorolac Tromethamine 10 mg tablet 1 tablet PO Q 8 H PRN  Tamsulosin Hcl 0.4 mg capsule 1 capsule PO Daily  Hydrocodone-Acetaminophen 5 mg-325 mg tablet 1 tablet PO Q 6 H PRN     GU PSH: Cysto Remove Stent FB Sim - 2022,  2020 Cysto/uretero W/up Stricture, Right - 2020 Cystoscopy Insert Stent, Right - 2022 Locm 300-399Mg /Ml Iodine,1Ml - 02/22/2023 Ureteroscopic laser litho, Right - 2020 Ureteroscopic stone removal, Right - 2022     NON-GU PSH: Global Fee For Extracorporeal Shock Wave Lithotripsy Treatment Of Kidney Stone(s)     GU PMH: Gross hematuria - 03/02/2023, - 02/02/2023 Renal calculus (Stable) - 03/02/2023, - 2022, - 2020 Ureteral calculus (Stable) - 03/02/2023, (Stable), Right UPJ calculus associate with right hydronephrosis, flank pain and AKI. Hx of right UPJO requiring endopyelotomy , - 2022, - 2020 History of urolithiasis - 02/22/2023, - 02/02/2023 Elevated PSA - 02/02/2023 Microscopic hematuria - 02/02/2023 Prostate nodule w/ LUTS - 02/02/2023 Ureteral stricture - 02/02/2023, - 2022, - 2020 Urinary Urgency - 02/02/2023 Flank Pain - 2022, - 2020 Right testicular pain - 2020 Nocturia - 2020 Renal cyst - 2020 Urinary Frequency - 2020 Hematuria, Unspec    NON-GU PMH: Low testosterone - 02/02/2023 Cardiac murmur, unspecified GERD Gout Hypercholesterolemia    FAMILY HISTORY: father deceased - Other Kidney Stones - Runs in Family patient's mother is still living - Other   SOCIAL HISTORY: Marital Status: Married Preferred Language: English; Ethnicity: Not Hispanic Or Latino; Race: White Current Smoking Status: Patient has never smoked.   Tobacco Use Assessment Completed: Used Tobacco in last 30 days? Drinks 5 drinks per week.  Drinks 2 caffeinated drinks per day. Patient's occupation Scientist, research (life sciences).    REVIEW OF SYSTEMS:    GU Review Male:   Patient denies frequent urination, hard to postpone urination, burning/ pain with urination, get up at night to urinate, leakage of urine, stream starts and stops, trouble starting your stream, have to strain to urinate , erection problems, and penile pain.  Gastrointestinal (Upper):   Patient denies nausea, vomiting, and indigestion/  heartburn.  Gastrointestinal (Lower):   Patient denies diarrhea and constipation.  Constitutional:   Patient denies fever, night sweats, weight loss, and fatigue.  Skin:   Patient denies skin rash/ lesion and itching.  Eyes:   Patient denies blurred vision and double vision.  Ears/ Nose/ Throat:   Patient denies sore throat and sinus problems.  Hematologic/Lymphatic:   Patient denies swollen glands and easy bruising.  Cardiovascular:   Patient denies leg swelling and chest pains.  Respiratory:   Patient denies cough and shortness of breath.  Endocrine:   Patient denies excessive thirst.  Musculoskeletal:   Patient denies back pain and joint pain.  Neurological:   Patient denies headaches and dizziness.  Psychologic:   Patient denies depression and anxiety.   VITAL SIGNS: None   MULTI-SYSTEM PHYSICAL EXAMINATION:    Constitutional: Well-nourished. No physical deformities. Normally developed. Good grooming.  Respiratory: No labored breathing, no use of accessory muscles.   Gastrointestinal: No mass, no tenderness, no rigidity, non obese abdomen.  Eyes: Normal conjunctivae. Normal eyelids.  Musculoskeletal: Normal gait and station of head and neck.     Complexity of Data:  Source Of History:  Patient  Lab Test Review:   PSA, Total Testosterone  Records Review:   Previous Doctor Records, Previous Patient Records  Urine Test Review:   Urinalysis  X-Ray Review: C.T. Abdomen/Pelvis: Reviewed Films. Reviewed Report. Discussed With  Patient.  MRI Prostate GSORAD: Reviewed Report. Discussed With Patient.     02/03/23  PSA  Total PSA 5.09 ng/mL  Free PSA 0.86 ng/mL  % Free PSA 17 % PSA    PROCEDURES:         Flexible Cystoscopy - 52000  Risks, benefits, and some of the potential complications of the procedure were discussed at length with the patient including infection, bleeding, voiding discomfort, urinary retention, fever, chills, sepsis, and others. All questions were answered.  Informed consent was obtained. Antibiotic prophylaxis was given. Sterile technique and intraurethral analgesia were used.  Meatus:  Normal size. Normal location. Normal condition.  Urethra:  No strictures.  External Sphincter:  Normal.  Verumontanum:  Normal.  Prostate:  Obstructing. Moderate hyperplasia.  Bladder Neck:  Non-obstructing.  Ureteral Orifices:  Normal location. Normal size. Normal shape. Effluxed clear urine.  Bladder:  No trabeculation. No tumors. Normal mucosa. No stones.      The lower urinary tract was carefully examined. The procedure was well-tolerated and without complications. Antibiotic instructions were given. Instructions were given to call the office immediately for bloody urine, difficulty urinating, urinary retention, painful or frequent urination, fever, chills, nausea, vomiting or other illness. The patient stated that he understood these instructions and would comply with them.   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 I did offer prostate biopsy versus observation and he elected for continued observation. If PSA continues to increase we can reconsider prostate biopsy.   For the patient's erectile dysfunction he is interested in sildenafil. Wants to hold off for now. Denies nitrates/nitroglycerin.   For his hypogonadism, he is interested in testosterone replacement. Hold off for now until treatment of stones.   For his left-sided calculi, he is scheduled for PCNL. Risk benefits discussed again including but not limited to  bleeding, infection, injury to surrounding structures, ureteral avulsion, kidney damage, hemorrhage, need for interventional radiology, need for blood transfusion, among other imponderables.        Next Appointment:      Next Appointment: 04/07/2023 12:30 PM    Appointment Type: Surgery     Location: [facility] [phone] [id number]    Provider: Modena Slater, III, M.D.    Reason for Visit: OBS WL LT PCNL      Signed by Modena Slater, III, M.D. on 03/20/23 at 2:23 PM (EDT

## 2023-04-07 NOTE — Anesthesia Postprocedure Evaluation (Signed)
Anesthesia Post Note  Patient: Samuel West  Procedure(s) Performed: LEFT NEPHROLITHOTOMY PERCUTANEOUS (Left)     Patient location during evaluation: PACU Anesthesia Type: General Level of consciousness: awake and alert Pain management: pain level controlled Vital Signs Assessment: post-procedure vital signs reviewed and stable Respiratory status: spontaneous breathing, nonlabored ventilation, respiratory function stable and patient connected to nasal cannula oxygen Cardiovascular status: blood pressure returned to baseline and stable Postop Assessment: no apparent nausea or vomiting Anesthetic complications: no  No notable events documented.  Last Vitals:  Vitals:   04/07/23 1545 04/07/23 1604  BP: (!) 158/106 (!) 177/103  Pulse: 65 70  Resp: 13 20  Temp: 36.7 C (!) 36.3 C  SpO2: 100% 99%    Last Pain:  Vitals:   04/07/23 1604  TempSrc: Oral  PainSc:                  Kennieth Rad

## 2023-04-08 ENCOUNTER — Encounter (HOSPITAL_COMMUNITY): Payer: Self-pay | Admitting: Urology

## 2023-04-08 DIAGNOSIS — N200 Calculus of kidney: Secondary | ICD-10-CM | POA: Diagnosis not present

## 2023-04-08 LAB — HEMOGLOBIN AND HEMATOCRIT, BLOOD
HCT: 45 % (ref 39.0–52.0)
Hemoglobin: 14.5 g/dL (ref 13.0–17.0)

## 2023-04-08 LAB — BASIC METABOLIC PANEL
Anion gap: 8 (ref 5–15)
BUN: 27 mg/dL — ABNORMAL HIGH (ref 8–23)
CO2: 23 mmol/L (ref 22–32)
Calcium: 8.8 mg/dL — ABNORMAL LOW (ref 8.9–10.3)
Chloride: 101 mmol/L (ref 98–111)
Creatinine, Ser: 1.59 mg/dL — ABNORMAL HIGH (ref 0.61–1.24)
GFR, Estimated: 48 mL/min — ABNORMAL LOW (ref >60)
Glucose, Bld: 147 mg/dL — ABNORMAL HIGH (ref 70–99)
Potassium: 4.7 mmol/L (ref 3.5–5.1)
Sodium: 132 mmol/L — ABNORMAL LOW (ref 135–145)

## 2023-04-08 MED ORDER — OXYCODONE HCL 5 MG PO TABS: 5.0000 mg | ORAL_TABLET | Freq: Four times a day (QID) | ORAL | 0 refills | Status: AC | PRN

## 2023-04-08 NOTE — TOC CM/SW Note (Signed)
Transition of Care [facility]) Screening Note  Patient Details  Name: Samuel West Date of Birth: 1960-12-07  Transition of Care [facility]) CM/SW Contact:    Ewing Schlein, LCSW Phone Number: 04/08/2023, 8:53 AM  Transition of Care Department [facility]) has reviewed patient and no TOC needs have been identified at this time. We will continue to monitor patient advancement through interdisciplinary progression rounds. If new patient transition needs arise, please place a TOC consult.

## 2023-04-08 NOTE — Discharge Summary (Signed)
Patient ID: Samuel West MRN: [id number] DOB/AGE: Sep 30, 1961 62 y.o.  Admit date: 04/07/2023 Discharge date: 04/08/2023  Primary Care Physician:  [facility]  Discharge Diagnoses:   Left renal calculi  Consults: None   Discharge Medications: Allergies as of 04/08/2023       Reactions   Morphine And Codeine Nausea And Vomiting        Medication List     STOP taking these medications    HYDROcodone-acetaminophen 5-325 MG tablet Commonly known as: NORCO/VICODIN       TAKE these medications    ketorolac 10 MG tablet Commonly known as: TORADOL Take 10 mg by mouth every 8 (eight) hours as needed (pain.).   loratadine 10 MG tablet Commonly known as: CLARITIN Take 10 mg by mouth daily as needed for allergies.   losartan 25 MG tablet Commonly known as: COZAAR Take 1 tablet (25 mg total) by mouth daily.   oxyCODONE 5 MG immediate release tablet Commonly known as: Oxy IR/ROXICODONE Take 1 tablet (5 mg total) by mouth every 6 (six) hours as needed for moderate pain.   tamsulosin 0.4 MG Caps capsule Commonly known as: FLOMAX Take 1 capsule (0.4 mg total) by mouth daily.   zolpidem 10 MG tablet Commonly known as: AMBIEN Take 10 mg by mouth at bedtime.   Zyloprim 300 MG tablet Generic drug: allopurinol Take 300 mg by mouth in the morning.         Significant Diagnostic Studies:  DG C-Arm 1-60 Min-No Report  Result Date: 04/07/2023 Fluoroscopy was utilized by the requesting physician.  No radiographic interpretation.   DG C-Arm 1-60 Min-No Report  Result Date: 04/07/2023 Fluoroscopy was utilized by the requesting physician.  No radiographic interpretation.   IR URETERAL STENT LEFT NEW ACCESS W/O SEP NEPHROSTOMY CATH  Result Date: 04/07/2023 CLINICAL DATA:  Symptomatic left nephrolithiasis, planned percutaneous nephrolithotomy EXAM: LEFT PERCUTANEOUS NEPHROURETERAL CATHETER PLACEMENT UNDER FLUOROSCOPIC GUIDANCE FLUOROSCOPY:  Radiation Exposure Index (as provided by the fluoroscopic device): 45 mGy air Kerma TECHNIQUE: The procedure, risks (including but not limited to bleeding, infection, organ damage ), benefits, and alternatives were explained to the patient. Questions regarding the procedure were encouraged and answered. The patient understands and consents to the procedure. LEFT flank region prepped with chlorhexidine, draped in usual sterile fashion, infiltrated locally with 1% lidocaine. Intravenous Fentanyl and Versed 4mg  were administered as conscious sedation during continuous monitoring of the patient's level of consciousness and physiological / cardiorespiratory status by the radiology RN, with a total moderate sedation time of 10 minutes. Under real-time fluoroscopic guidance, a 21-gauge trocar needle was advanced into a posterior lower pole calyx using the peripheral radiodense calculus as a guide. A 018 guidewire advanced easily into the central collecting system. Needle was exchanged over a guidewire for transitional dilator. Contrast injection confirmed appropriate positioning. Catheter was exchanged over a guidewire for a 5 [place] Kumpe catheter, advanced down the ureter into the urinary bladder. Radiograph confirms appropriate nephroureteral catheter positioning. Catheter capped and secured externally. The patient tolerated the procedure well. COMPLICATIONS: COMPLICATIONS None. IMPRESSION: 1. Technically successful left antegrade percutaneous nephroureteral catheter placement. Electronically Signed   By: Corlis Leak M.D.   On: 04/07/2023 11:01    Brief H and P: For complete details please refer to admission H and P, but in brief 62 year old male with multiple left renal calculi presents for left percutaneous nephrolithotomy.  Hospital Course:  The patient was admitted to the urology service on 04/07/2023.  He  was taken to the operating room where he underwent a left percutaneous nephrolithotomy.  Details of  the procedure may be found in the operative note located in the patient's chart.  The patient's postoperative course was unremarkable as he remained afebrile and hemodynamically stable.  He was tolerating a regular diet.  His pain was controlled.  He was able to void following catheter removal on postop day #1.  Labs showed a stable hemoglobin and hematocrit.  His creatinine was slightly increased to 1.59.  He was felt to be stable for discharge home on postoperative day #1.  Day of Discharge BP 122/75 (BP Location: Left Arm)   Pulse 78   Temp 98.2 F (36.8 C) (Oral)   Resp 16   Ht 6' (1.829 m)   Wt 103.4 kg   SpO2 93%   BMI 30.92 kg/m   Results for orders placed or performed during the hospital encounter of 04/07/23 (from the past 24 hour(s))  Type and screen [facility]     Status: None   Collection Time: 04/07/23  1:05 PM  Result Value Ref Range   ABO/RH(D) O POS    Antibody Screen NEG    Sample Expiration      04/10/2023,2359 Performed at [facility], [address] [place], [place] [address]   ABO/Rh     Status: None   Collection Time: 04/07/23  2:25 PM  Result Value Ref Range   ABO/RH(D)      O POS Performed at [facility], [address] [place], [place] [address]   Basic metabolic panel     Status: Abnormal   Collection Time: 04/07/23  2:58 PM  Result Value Ref Range   Sodium 137 135 - 145 mmol/L   Potassium 4.5 3.5 - 5.1 mmol/L   Chloride 102 98 - 111 mmol/L   CO2 23 22 - 32 mmol/L   Glucose, Bld 129 (H) 70 - 99 mg/dL   BUN 18 8 - 23 mg/dL   Creatinine, Ser 0.98 (H) 0.61 - 1.24 mg/dL   Calcium 9.1 8.9 - 11.9 mg/dL   GFR, Estimated >14 >78 mL/min   Anion gap 12 5 - 15  Hemoglobin and hematocrit, blood     Status: None   Collection Time: 04/07/23  2:58 PM  Result Value Ref Range   Hemoglobin 16.3 13.0 - 17.0 g/dL   HCT 29.5 62.1 - 30.8 %  Hemoglobin and hematocrit, blood     Status: None   Collection  Time: 04/08/23  5:39 AM  Result Value Ref Range   Hemoglobin 14.5 13.0 - 17.0 g/dL   HCT 65.7 84.6 - 96.2 %  Basic metabolic panel     Status: Abnormal   Collection Time: 04/08/23  5:39 AM  Result Value Ref Range   Sodium 132 (L) 135 - 145 mmol/L   Potassium 4.7 3.5 - 5.1 mmol/L   Chloride 101 98 - 111 mmol/L   CO2 23 22 - 32 mmol/L   Glucose, Bld 147 (H) 70 - 99 mg/dL   BUN 27 (H) 8 - 23 mg/dL   Creatinine, Ser 9.52 (H) 0.61 - 1.24 mg/dL   Calcium 8.8 (L) 8.9 - 10.3 mg/dL   GFR, Estimated 48 (L) >60 mL/min   Anion gap 8 5 - 15    Physical Exam: General: Alert and awake oriented x3 not in any acute distress. HEENT: anicteric sclera, pupils reactive to light and accommodation CVS: S1-S2 clear no murmur  rubs or gallops Chest: clear to auscultation bilaterally, no wheezing rales or rhonchi Abdomen: soft nontender, nondistended, normal bowel sounds, no organomegaly Extremities: no cyanosis, clubbing or edema noted bilaterally Neuro: Cranial nerves II-XII intact, no focal neurological deficits Back: Left flank incision clean, dry, intact  Disposition: Home  Diet: Regular  Activity: As tolerated; no heavy lifting or straining x 1 week   TESTS THAT NEED FOLLOW-UP  None  DISCHARGE FOLLOW-UP   Follow-up Information     Ray Church III, MD Follow up in 1 week(s).   Specialty: Urology Contact information: [address] [place] [address] [phone]                 Time spent on Discharge:  30 minutes  Signed: Di Kindle 04/08/2023, 8:55 AM
